# Patient Record
Sex: Male | Born: 1945 | Race: White | Hispanic: No | Marital: Married | Smoking: Former smoker
Health system: Southern US, Community
[De-identification: ages and names within clinical notes are randomized; demographics above are authoritative.]

## PROBLEM LIST (undated history)

## (undated) DIAGNOSIS — M5136 Other intervertebral disc degeneration, lumbar region: Secondary | ICD-10-CM

## (undated) DIAGNOSIS — I472 Ventricular tachycardia: Secondary | ICD-10-CM

## (undated) DIAGNOSIS — D696 Thrombocytopenia, unspecified: Secondary | ICD-10-CM

## (undated) DIAGNOSIS — G4730 Sleep apnea, unspecified: Secondary | ICD-10-CM

## (undated) DIAGNOSIS — E1122 Type 2 diabetes mellitus with diabetic chronic kidney disease: Secondary | ICD-10-CM

## (undated) DIAGNOSIS — I2510 Atherosclerotic heart disease of native coronary artery without angina pectoris: Secondary | ICD-10-CM

## (undated) DIAGNOSIS — I509 Heart failure, unspecified: Secondary | ICD-10-CM

## (undated) DIAGNOSIS — N186 End stage renal disease: Secondary | ICD-10-CM

## (undated) DIAGNOSIS — E119 Type 2 diabetes mellitus without complications: Secondary | ICD-10-CM

## (undated) DIAGNOSIS — I255 Ischemic cardiomyopathy: Secondary | ICD-10-CM

## (undated) DIAGNOSIS — I4892 Unspecified atrial flutter: Secondary | ICD-10-CM

## (undated) DIAGNOSIS — I729 Aneurysm of unspecified site: Secondary | ICD-10-CM

## (undated) DIAGNOSIS — Z85118 Personal history of other malignant neoplasm of bronchus and lung: Secondary | ICD-10-CM

## (undated) DIAGNOSIS — D638 Anemia in other chronic diseases classified elsewhere: Secondary | ICD-10-CM

## (undated) DIAGNOSIS — E782 Mixed hyperlipidemia: Secondary | ICD-10-CM

## (undated) DIAGNOSIS — E785 Hyperlipidemia, unspecified: Secondary | ICD-10-CM

## (undated) DIAGNOSIS — I482 Chronic atrial fibrillation, unspecified: Secondary | ICD-10-CM

## (undated) DIAGNOSIS — Z992 Dependence on renal dialysis: Secondary | ICD-10-CM

## (undated) DIAGNOSIS — R0609 Other forms of dyspnea: Secondary | ICD-10-CM

## (undated) DIAGNOSIS — K469 Unspecified abdominal hernia without obstruction or gangrene: Secondary | ICD-10-CM

## (undated) DIAGNOSIS — I714 Abdominal aortic aneurysm, without rupture: Secondary | ICD-10-CM

## (undated) HISTORY — DX: Thrombocytopenia, unspecified: D69.6

## (undated) HISTORY — DX: Other intervertebral disc degeneration, lumbar region: M51.36

## (undated) HISTORY — DX: Personal history of other malignant neoplasm of bronchus and lung: Z85.118

## (undated) HISTORY — PX: KNEE SURGERY: SHX244

## (undated) HISTORY — PX: PARTIAL COLECTOMY: SHX5273

## (undated) HISTORY — DX: Atherosclerotic heart disease of native coronary artery without angina pectoris: I25.10

## (undated) HISTORY — PX: REPLACEMENT TOTAL KNEE: SUR1224

## (undated) HISTORY — DX: Anemia in other chronic diseases classified elsewhere: D63.8

## (undated) HISTORY — PX: CARDIAC DEFIBRILLATOR PLACEMENT: SHX171

## (undated) HISTORY — DX: Chronic atrial fibrillation, unspecified: I48.20

## (undated) HISTORY — DX: Other forms of dyspnea: R06.09

## (undated) HISTORY — PX: CORONARY ARTERY BYPASS GRAFT: SHX141

## (undated) HISTORY — DX: Type 2 diabetes mellitus with diabetic chronic kidney disease: E11.22

## (undated) HISTORY — DX: Abdominal aortic aneurysm, without rupture: I71.4

## (undated) HISTORY — DX: Dependence on renal dialysis: Z99.2

## (undated) HISTORY — DX: Heart failure, unspecified: I50.9

## (undated) HISTORY — PX: CARDIAC SURGERY: SHX584

## (undated) HISTORY — DX: Unspecified atrial flutter: I48.92

## (undated) HISTORY — PX: COLON SURGERY: SHX602

## (undated) HISTORY — DX: Ischemic cardiomyopathy: I25.5

## (undated) HISTORY — PX: LUNG LOBECTOMY: SHX167

## (undated) HISTORY — DX: End stage renal disease: N18.6

## (undated) HISTORY — DX: Mixed hyperlipidemia: E78.2

## (undated) HISTORY — DX: Ventricular tachycardia: I47.2

---
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
## 2010-05-12 DIAGNOSIS — I472 Ventricular tachycardia, unspecified: Secondary | ICD-10-CM | POA: Insufficient documentation

## 2010-05-12 HISTORY — DX: Ventricular tachycardia, unspecified: I47.20

## 2010-05-12 HISTORY — DX: Ventricular tachycardia: I47.2

## 2014-01-12 ENCOUNTER — Emergency Department (INDEPENDENT_AMBULATORY_CARE_PROVIDER_SITE_OTHER)
Admission: EM | Admit: 2014-01-12 | Discharge: 2014-01-12 | Payer: PRIVATE HEALTH INSURANCE | Source: Home / Self Care | Attending: Emergency Medicine | Admitting: Emergency Medicine

## 2014-01-12 ENCOUNTER — Encounter: Payer: Self-pay | Admitting: *Deleted

## 2014-01-12 DIAGNOSIS — R0600 Dyspnea, unspecified: Secondary | ICD-10-CM

## 2014-01-12 HISTORY — DX: Aneurysm of unspecified site: I72.9

## 2014-01-12 HISTORY — DX: Type 2 diabetes mellitus without complications: E11.9

## 2014-01-12 HISTORY — DX: Hyperlipidemia, unspecified: E78.5

## 2014-01-12 HISTORY — DX: Unspecified abdominal hernia without obstruction or gangrene: K46.9

## 2014-01-12 HISTORY — DX: Sleep apnea, unspecified: G47.30

## 2014-01-12 NOTE — ED Provider Notes (Signed)
CSN: [id number]     Arrival date & time 01/12/14  1024 History   First MD Initiated Contact with Patient 01/12/14 1029     Chief Complaint  Patient presents with  . Shortness of Breath   (Consider location/radiation/quality/duration/timing/severity/associated sxs/prior Treatment) Patient is a 68 y.o. male presenting with shortness of breath. The history is provided by the patient.  Shortness of Breath Severity:  Moderate Onset quality:  Gradual Duration:  1 week Timing:  Constant Progression:  Worsening Chronicity:  New Context: activity   Relieved by:  Diuretics Ineffective treatments:  None tried Associated symptoms: no abdominal pain and no chest pain   Risk factors: no hx of PE/DVT and no prolonged immobilization    patient is visiting here from [place] he reports he has been short of breath for the past week. He is unsure if shortness of breath started after flying to [facility] before. Patient reports he is barely able to walk due to shortness of breath. She reports he does feel like he had some chills last night and he has had some nasal congestion he denies any chest pain patient denies any fevers. And abdominal hernia. Patient is a former smoker. She reports he is currently seeing a different doctors in his hometown in [place].  Past Medical History  Diagnosis Date  . Hernia of abdominal cavity   . Diabetes mellitus without complication   . Hyperlipidemia   . Sleep apnea   . Aneurysm     multiple    Past Surgical History  Procedure Laterality Date  . Colon surgery    . Knee surgery Bilateral   . Cardiac surgery      Quad Bypass  . Cardiac defibrillator placement     Family History  Problem Relation Age of Onset  . Dementia Mother   . Cancer Father     colon   History  Substance Use Topics  . Smoking status: Former Research scientist (life sciences)  . Smokeless tobacco: Not on file  . Alcohol Use: No    Review of Systems  Respiratory: Positive for shortness of breath.    Cardiovascular: Negative for chest pain.       Tachycardic  Gastrointestinal: Negative for abdominal pain.  All other systems reviewed and are negative.   Allergies  Review of patient's allergies indicates no known allergies.  Home Medications   Prior to Admission medications   Medication Sig Start Date End Date Taking? Authorizing Provider  aspirin 81 MG chewable tablet Chew by mouth daily.   Yes Historical Provider, MD  carvedilol (COREG) 6.25 MG tablet Take 6.25 mg by mouth 2 (two) times daily with a meal.   Yes Historical Provider, MD  Cholecalciferol (VITAMIN D-3 PO) Take by mouth.   Yes Historical Provider, MD  fenofibrate 160 MG tablet Take 160 mg by mouth daily.   Yes Historical Provider, MD  fesoterodine (TOVIAZ) 8 MG TB24 tablet Take 8 mg by mouth daily.   Yes Historical Provider, MD  Fish Oil-Cholecalciferol (FISH OIL + D3 PO) Take by mouth.   Yes Historical Provider, MD  glimepiride (AMARYL) 4 MG tablet Take 4 mg by mouth daily with breakfast.   Yes Historical Provider, MD  MULTIPLE VITAMIN PO Take by mouth.   Yes Historical Provider, MD  rosuvastatin (CRESTOR) 20 MG tablet Take 20 mg by mouth daily.   Yes Historical Provider, MD  sevelamer carbonate (RENVELA) 800 MG tablet Take 800 mg by mouth 3 (three) times daily with meals.  Yes Historical Provider, MD   BP 153/78 mmHg  Pulse 105  Temp(Src) 98 F (36.7 C) (Oral)  Resp 24  Ht 6\' 1"  (1.854 m)  Wt 297 lb (134.718 kg)  BMI 39.19 kg/m2  SpO2 91% Physical Exam  Constitutional: He appears well-developed and well-nourished.  HENT:  Head: Normocephalic.  Right Ear: External ear normal.  Left Ear: External ear normal.  Nose: Nose normal.  Mouth/Throat: Oropharynx is clear and moist.  Eyes: Pupils are equal, round, and reactive to light.  Neck: Normal range of motion. Neck supple.  Cardiovascular: Normal heart sounds.   Tachycardia  Pulmonary/Chest: Effort normal and breath sounds normal.  Abdominal: Soft. He  exhibits mass. There is tenderness.  Musculoskeletal: He exhibits edema.  Neurological: He is alert.  Skin: Skin is warm.  Psychiatric: He has a normal mood and affect.  Nursing note and vitals reviewed.   ED Course  Procedures (including critical care time) Labs Review Labs Reviewed - No data to display  Imaging Review No results found.   MDM patient recently had a dialysis graft placed in left arm due to progressing renal failure. He has edema in bilateral lower extremities and heart rate is elevated. I discussed patient's medical problems with him and his family I think he would be best served by being at an emergency department with the capability of checking his renal function cardiac markers and rule out pulmonary embolus. Patient understands and agrees. I discussed options with patient and son. Son would like patient to go to the [place], PA-C 01/12/14 1057

## 2014-01-12 NOTE — ED Notes (Signed)
Sent to the ED for further evaluation per Alyse Low, PA.

## 2014-01-12 NOTE — ED Notes (Signed)
Pt c/o SOB and chills x last night. Denies any recent URI or CP. He does c/o some nasal congestion for a while.

## 2014-01-13 DIAGNOSIS — N185 Chronic kidney disease, stage 5: Secondary | ICD-10-CM | POA: Insufficient documentation

## 2014-09-23 DIAGNOSIS — I4892 Unspecified atrial flutter: Secondary | ICD-10-CM

## 2014-09-23 HISTORY — DX: Unspecified atrial flutter: I48.92

## 2015-05-13 DIAGNOSIS — R0600 Dyspnea, unspecified: Secondary | ICD-10-CM

## 2015-05-13 DIAGNOSIS — R0609 Other forms of dyspnea: Secondary | ICD-10-CM

## 2015-05-13 DIAGNOSIS — Z7901 Long term (current) use of anticoagulants: Secondary | ICD-10-CM | POA: Insufficient documentation

## 2015-05-13 HISTORY — DX: Dyspnea, unspecified: R06.00

## 2015-05-13 HISTORY — DX: Other forms of dyspnea: R06.09

## 2016-02-28 LAB — PULMONARY FUNCTION TEST

## 2016-06-19 DIAGNOSIS — R079 Chest pain, unspecified: Secondary | ICD-10-CM | POA: Insufficient documentation

## 2016-10-04 ENCOUNTER — Ambulatory Visit: Payer: PRIVATE HEALTH INSURANCE | Admitting: Osteopathic Medicine

## 2016-10-09 DIAGNOSIS — I255 Ischemic cardiomyopathy: Secondary | ICD-10-CM

## 2016-10-09 HISTORY — DX: Ischemic cardiomyopathy: I25.5

## 2016-10-11 ENCOUNTER — Ambulatory Visit (INDEPENDENT_AMBULATORY_CARE_PROVIDER_SITE_OTHER): Payer: Medicare Other | Admitting: Physician Assistant

## 2016-10-11 ENCOUNTER — Encounter: Payer: Self-pay | Admitting: Physician Assistant

## 2016-10-11 ENCOUNTER — Ambulatory Visit: Payer: PRIVATE HEALTH INSURANCE

## 2016-10-11 ENCOUNTER — Encounter (INDEPENDENT_AMBULATORY_CARE_PROVIDER_SITE_OTHER): Payer: Self-pay

## 2016-10-11 VITALS — BP 89/56 | HR 81 | Wt 264.0 lb

## 2016-10-11 DIAGNOSIS — I209 Angina pectoris, unspecified: Secondary | ICD-10-CM | POA: Diagnosis not present

## 2016-10-11 DIAGNOSIS — I482 Chronic atrial fibrillation, unspecified: Secondary | ICD-10-CM

## 2016-10-11 DIAGNOSIS — M5136 Other intervertebral disc degeneration, lumbar region: Secondary | ICD-10-CM

## 2016-10-11 DIAGNOSIS — Z85118 Personal history of other malignant neoplasm of bronchus and lung: Secondary | ICD-10-CM | POA: Diagnosis not present

## 2016-10-11 DIAGNOSIS — Z992 Dependence on renal dialysis: Secondary | ICD-10-CM

## 2016-10-11 DIAGNOSIS — Z950 Presence of cardiac pacemaker: Secondary | ICD-10-CM | POA: Diagnosis not present

## 2016-10-11 DIAGNOSIS — D696 Thrombocytopenia, unspecified: Secondary | ICD-10-CM

## 2016-10-11 DIAGNOSIS — N400 Enlarged prostate without lower urinary tract symptoms: Secondary | ICD-10-CM | POA: Diagnosis not present

## 2016-10-11 DIAGNOSIS — N186 End stage renal disease: Secondary | ICD-10-CM

## 2016-10-11 DIAGNOSIS — D638 Anemia in other chronic diseases classified elsewhere: Secondary | ICD-10-CM | POA: Diagnosis not present

## 2016-10-11 DIAGNOSIS — Z23 Encounter for immunization: Secondary | ICD-10-CM

## 2016-10-11 DIAGNOSIS — Z8669 Personal history of other diseases of the nervous system and sense organs: Secondary | ICD-10-CM | POA: Diagnosis not present

## 2016-10-11 DIAGNOSIS — I2510 Atherosclerotic heart disease of native coronary artery without angina pectoris: Secondary | ICD-10-CM

## 2016-10-11 DIAGNOSIS — Z7689 Persons encountering health services in other specified circumstances: Secondary | ICD-10-CM

## 2016-10-11 DIAGNOSIS — I480 Paroxysmal atrial fibrillation: Secondary | ICD-10-CM | POA: Insufficient documentation

## 2016-10-11 DIAGNOSIS — I25119 Atherosclerotic heart disease of native coronary artery with unspecified angina pectoris: Secondary | ICD-10-CM

## 2016-10-11 DIAGNOSIS — M51369 Other intervertebral disc degeneration, lumbar region without mention of lumbar back pain or lower extremity pain: Secondary | ICD-10-CM | POA: Insufficient documentation

## 2016-10-11 DIAGNOSIS — E1122 Type 2 diabetes mellitus with diabetic chronic kidney disease: Secondary | ICD-10-CM

## 2016-10-11 DIAGNOSIS — R140 Abdominal distension (gaseous): Secondary | ICD-10-CM | POA: Insufficient documentation

## 2016-10-11 DIAGNOSIS — E782 Mixed hyperlipidemia: Secondary | ICD-10-CM

## 2016-10-11 DIAGNOSIS — Z95810 Presence of automatic (implantable) cardiac defibrillator: Secondary | ICD-10-CM | POA: Insufficient documentation

## 2016-10-11 DIAGNOSIS — I509 Heart failure, unspecified: Secondary | ICD-10-CM

## 2016-10-11 DIAGNOSIS — Z951 Presence of aortocoronary bypass graft: Secondary | ICD-10-CM | POA: Insufficient documentation

## 2016-10-11 HISTORY — DX: Thrombocytopenia, unspecified: D69.6

## 2016-10-11 HISTORY — DX: Atherosclerotic heart disease of native coronary artery without angina pectoris: I25.10

## 2016-10-11 HISTORY — DX: Other intervertebral disc degeneration, lumbar region without mention of lumbar back pain or lower extremity pain: M51.369

## 2016-10-11 HISTORY — DX: Chronic atrial fibrillation, unspecified: I48.20

## 2016-10-11 HISTORY — DX: Heart failure, unspecified: I50.9

## 2016-10-11 HISTORY — DX: End stage renal disease: N18.6

## 2016-10-11 HISTORY — DX: Personal history of other malignant neoplasm of bronchus and lung: Z85.118

## 2016-10-11 HISTORY — DX: Mixed hyperlipidemia: E78.2

## 2016-10-11 HISTORY — DX: Type 2 diabetes mellitus with diabetic chronic kidney disease: E11.22

## 2016-10-11 HISTORY — DX: Other intervertebral disc degeneration, lumbar region: M51.36

## 2016-10-11 HISTORY — DX: Anemia in other chronic diseases classified elsewhere: D63.8

## 2016-10-11 LAB — POCT INR: INR: 1.6

## 2016-10-11 NOTE — Patient Instructions (Addendum)
Warfarin -increase dose - 8 mg on Tuesday and Thursday - 7 mg on all other days - return in 1 week   Reduce your Losartan to 1/2 pill daily

## 2016-10-11 NOTE — Progress Notes (Signed)
HPI:                                                                Justin Browning is a 71 y.o. male who presents to [facility] today to establish care  Patient has a complex medical history  ESRD: followed by [facility]. Dialysis on M, W, F.   Afib/CHF/CAD: has an appointment with [facility] in [place]. He is on Warfarin 7mg  daily. Taking Carvedilol and Amiodarone without issues. He takes Torsedmide 50mg  on non-dialysis days. Compliant with medications.  Hx of adenocarcinoma of lung: s/p RLL lobectomy. He is on 2L of O2 at home. Requesting referral to pulmonology.   DMII: taking Tradjenta and Glimeperide daily. Not a candidate for Metformin due to renal disease. Compliant with medications. Last A1C unknown. Denies polyuria, vision change, and paresthesias. Denies hypoglycemic events. Denies ulcers/wounds on feet.  Depression/Anxiety: reports depressed mood due to chronic health issues, especially ESRD and dialysis. He is frustrated by his health and that he cannot go on vacation due to dialysis. He also has chronic back pain that depresses him. He endorses suicidal ideations, but states his grandchildren keep him going. Denies having a plan. Lives at home with his wife. No guns int he home. Denies symptoms of mania/hypomania. Denies auditory/visual hallucinations.  BPH: reports history of enlarged prostate and slightly elevated PSA. Requesting referral to Urology.   Past Medical History:  Diagnosis Date  . Anemia of chronic disease 10/11/2016  . Aneurysm ([place])    multiple   . Atrial flutter ([place]) 09/23/2014  . CAD (coronary artery disease) 10/11/2016   Overview:  Non STEMI 1/06 Cardiac Catheterization 1/06-Total occlusion of the LAD,OM1,LCX,RPDA CABG 02/15/04(LIMA-LAD,SVG-OM1,SVG-OM2-OM3) Postoperative course complicated by recurrent ventricular and atrial arrhythmias.Acute abdomen requiring exploratory laparotomy, partial  resection of the rectosigmoid colon, diverting colostomy s/p revision. AICD 2/06  . Chronic atrial fibrillation ([place]) 10/11/2016  . Chronic congestive heart failure ([place]) 10/11/2016  . Diabetes mellitus without complication ([place])   . Dyspnea on exertion 05/13/2015  . ESRD (end stage renal disease) on dialysis ([place])   . ESRD on dialysis ([place]) 10/11/2016  . Hernia of abdominal cavity   . History of adenocarcinoma of lung 10/11/2016   s/p partial lobectomy right lung  . Hyperlipidemia   . Ischemic cardiomyopathy 10/09/2016  . Mixed hyperlipidemia 10/11/2016  . Sleep apnea   . Thrombocytopenia ([place]) 10/11/2016  . Type 2 diabetes mellitus with chronic kidney disease on chronic dialysis, without long-term current use of insulin ([place]) 10/11/2016  . VT (ventricular tachycardia) ([place]) 05/12/2010   Overview:  AICD 03/09/04 [employer]   Past Surgical History:  Procedure Laterality Date  . CARDIAC DEFIBRILLATOR PLACEMENT    . CARDIAC SURGERY     Quad Bypass  . COLON SURGERY    . CORONARY ARTERY BYPASS GRAFT    . KNEE SURGERY Bilateral   . LUNG LOBECTOMY Right   . PARTIAL COLECTOMY     Social History  Substance Use Topics  . Smoking status: Former Smoker    Packs/day: 3.00    Years: 40.00    Types: Cigarettes  . Smokeless tobacco: Never Used  . Alcohol use No   family history includes Cancer in his father; Dementia in his mother.  ROS: Review of Systems  Constitutional: Negative.   HENT: Positive for tinnitus.   Eyes: Negative.   Respiratory: Positive for shortness of breath.   Cardiovascular: Positive for palpitations. Negative for chest pain.  Gastrointestinal: Negative.   Musculoskeletal: Positive for joint pain.  Neurological: Negative.   Endo/Heme/Allergies: Bruises/bleeds easily.       Anemia/thrombocytopenia  Psychiatric/Behavioral: The patient is nervous/anxious and has insomnia.      Medications: Current Outpatient Prescriptions  Medication Sig Dispense Refill  .  amiodarone (PACERONE) 200 MG tablet Take 200 mg by mouth daily.    [name] aspirin 81 MG chewable tablet Chew by mouth daily.    [name] atorvastatin (LIPITOR) 20 MG tablet Take 20 mg by mouth at bedtime.    . carvedilol (COREG) 6.25 MG tablet Take 6.25 mg by mouth 2 (two) times daily with a meal.    . Cholecalciferol (VITAMIN D-3 PO) Take by mouth.    . MULTIPLE VITAMIN PO Take by mouth.    . OXYGEN Inhale 2 L into the lungs as needed.    . sevelamer carbonate (RENVELA) 800 MG tablet Take 800 mg by mouth 3 (three) times daily with meals.    . warfarin (COUMADIN) 2 MG tablet Take 2 mg by mouth daily.    [name] warfarin (COUMADIN) 5 MG tablet Take 5 mg by mouth daily.    . Aspirin-Calcium Carbonate 81-777 MG TABS Take 1 tablet by mouth daily.    . Cholecalciferol (VITAMIN D3) 1000 units CAPS Vitamin D3 1,000 unit capsule  Take by oral route.    . fenofibrate 160 MG tablet Take 160 mg by mouth daily.    . fesoterodine (TOVIAZ) 8 MG TB24 tablet Take 8 mg by mouth daily.    . Fish Oil-Cholecalciferol (FISH OIL + D3 PO) Take by mouth.    . gabapentin (NEURONTIN) 300 MG capsule Take 300 mg by mouth daily.    [name] glimepiride (AMARYL) 4 MG tablet Take 4 mg by mouth daily with breakfast.    . linagliptin (TRADJENTA) 5 MG TABS tablet Take 5 mg by mouth daily.    . pantoprazole (PROTONIX) 20 MG tablet Take 40 mg by mouth daily.    [name] torsemide (DEMADEX) 100 MG tablet Take 50 mg by mouth. Take 1/2 tab PO on nondialysis days     No current facility-administered medications for this visit.    No Known Allergies     Objective:  BP (!) 89/56   Pulse 81   Wt 264 lb (119.7 kg)   BMI 34.83 kg/m  Gen:  alert, not ill-appearing, no distress, appropriate for age, obese male HEENT: head normocephalic without obvious abnormality, conjunctiva and cornea clear, trachea midline Pulm: Normal work of breathing, normal phonation, clear to auscultation bilaterally, no wheezes, rales or rhonchi CV: Normal rate, regular rhythm, s1  and s2 distinct Neuro: alert and oriented x 3, no tremor MSK: extremities atraumatic, fistula right arm, well-healed longitudinal surgical scars on b/l knees, ambulates with a cane, Skin: intact, no rashes on exposed skin, no jaundice, no cyanosis Psych: well-groomed, cooperative, good eye contact, depressed mood, affect mood-congruent, speech is articulate, and thought processes clear and goal-directed   Results for orders placed or performed in visit on 10/11/16 (from the past 72 hour(s))  POCT INR     Status: None   Collection Time: 10/11/16  9:51 AM  Result Value Ref Range   INR 1.6    No results found.   Assessment and Plan: 71 y.o. male  with   1. Encounter to establish care - reviewed PMH, PSH, PFH, medications and allergies  2. History of adenocarcinoma of lung - Ambulatory referral to Pulmonology  3. ESRD on dialysis ([place]) - cont dialysis, renal diet and follow-up with Nephrology  4. Chronic a-fib (HCC) - POCT INR subtherapeutic. Increasing dose to 8mg  on Tues, Thurs and 7mg  on all other days - follow-up in 1 week  5. History of sleep apnea - reports most recent sleep study was normal and no indication for CPAP  6. Type 2 diabetes mellitus with chronic kidney disease on chronic dialysis, without long-term current use of insulin (HCC) - cont current regimen - follow-up in 3 months  7. Mixed hyperlipidemia - cont statin  8. Artificial cardiac pacemaker - POCT INR  9. Coronary artery disease involving native heart with angina pectoris, unspecified vessel or lesion type, Chronic CHF - cont statin and baby asa.  - hold Losartan. BP is low - Follow-up with Cardiology  10. Anemia of chronic disease  11. Thrombocytopenia ([place])   12. Enlarged prostate - Ambulatory referral to Urology  13. Needs flu shot - Flu vaccine HIGH DOSE PF (Fluzone High dose)  14. Lumbar degenerative disc disease - cont Gabapentin daily - follow-up with Sports Medicine as  needed  Patient education and anticipatory guidance given Patient agrees with treatment plan Follow-up in 3 months or sooner as needed   Darlyne Russian PA-C

## 2016-10-15 ENCOUNTER — Encounter: Payer: Self-pay | Admitting: Physician Assistant

## 2016-10-18 ENCOUNTER — Telehealth: Payer: Self-pay | Admitting: *Deleted

## 2016-10-18 ENCOUNTER — Ambulatory Visit: Payer: PRIVATE HEALTH INSURANCE

## 2016-10-18 DIAGNOSIS — I482 Chronic atrial fibrillation, unspecified: Secondary | ICD-10-CM

## 2016-10-18 NOTE — Telephone Encounter (Signed)
Lab order sent for PT/INR

## 2016-10-19 ENCOUNTER — Telehealth: Payer: Self-pay

## 2016-10-19 LAB — PROTIME-INR
INR: 1.7 — AB
PROTHROMBIN TIME: 18.4 s — AB (ref 9.0–11.5)

## 2016-10-19 NOTE — Telephone Encounter (Signed)
Pt has 5 mg and 2 mg tabs.  Please disregard last note.  Wife was confused on doses he has.  He knows to take 8 mg on Sun, Tues, and Thurs, and 7 mg all other days.

## 2016-10-19 NOTE — Telephone Encounter (Signed)
Pt called regarding his INR and what medication he needs.  Please advise.

## 2016-10-19 NOTE — Telephone Encounter (Signed)
INR is subtherapeutic Increase dose 8 mg on Sunday, Tuesday, Thursday 7 mg on all other days Repeat INR in 10 days

## 2016-10-19 NOTE — Telephone Encounter (Signed)
Pt has only 5mg , and 2.5 mg tabs.  Can you order 1mg  or 2mg  tabs so they can do these doses?  Please advise.

## 2016-11-01 ENCOUNTER — Ambulatory Visit (INDEPENDENT_AMBULATORY_CARE_PROVIDER_SITE_OTHER): Payer: Medicare Other | Admitting: Physician Assistant

## 2016-11-01 DIAGNOSIS — I482 Chronic atrial fibrillation, unspecified: Secondary | ICD-10-CM

## 2016-11-01 LAB — POCT INR: INR: 2.8

## 2016-11-01 NOTE — Progress Notes (Signed)
Pt's wife notified of instructions and to return in 2 weeks to recheck INR.

## 2016-11-15 ENCOUNTER — Telehealth: Payer: Self-pay | Admitting: *Deleted

## 2016-11-15 ENCOUNTER — Ambulatory Visit (INDEPENDENT_AMBULATORY_CARE_PROVIDER_SITE_OTHER): Payer: Medicare Other | Admitting: Physician Assistant

## 2016-11-15 DIAGNOSIS — I482 Chronic atrial fibrillation, unspecified: Secondary | ICD-10-CM

## 2016-11-15 LAB — POCT INR: INR: 3.8

## 2016-11-15 NOTE — Telephone Encounter (Signed)
Called and notified spouse of recommendation. They will return to clinic in 10 days

## 2016-11-23 ENCOUNTER — Telehealth: Payer: Self-pay | Admitting: Physician Assistant

## 2016-11-23 NOTE — Telephone Encounter (Signed)
Spoke to [facility] They would like Crue to hold his Coumadin for 72 hours before his procedure Procedure is Tuesday, so he should have his last dose tonight. He does not need to come in for INR check with [name] next week. He should receive instructions from dialysis on when he can resume his Coumadin. Once he re-starts his Coumadin, recommend he come in for INR check in 1 week

## 2016-11-23 NOTE — Telephone Encounter (Signed)
Wife notified -EH/RMA  

## 2016-11-27 ENCOUNTER — Ambulatory Visit: Payer: Medicare Other

## 2016-11-27 ENCOUNTER — Ambulatory Visit: Payer: PRIVATE HEALTH INSURANCE

## 2016-11-27 ENCOUNTER — Other Ambulatory Visit: Payer: Self-pay

## 2016-11-27 DIAGNOSIS — I482 Chronic atrial fibrillation, unspecified: Secondary | ICD-10-CM

## 2016-11-27 LAB — PROTIME-INR
INR: 1.3 — ABNORMAL HIGH
Prothrombin Time: 13.5 s — ABNORMAL HIGH (ref 9.0–11.5)

## 2016-11-28 ENCOUNTER — Other Ambulatory Visit: Payer: Self-pay

## 2016-11-28 DIAGNOSIS — I482 Chronic atrial fibrillation, unspecified: Secondary | ICD-10-CM

## 2016-12-03 ENCOUNTER — Other Ambulatory Visit: Payer: Self-pay

## 2016-12-03 DIAGNOSIS — I482 Chronic atrial fibrillation, unspecified: Secondary | ICD-10-CM

## 2016-12-03 MED ORDER — WARFARIN SODIUM 2 MG PO TABS: 2.0000 mg | ORAL_TABLET | Freq: Every day | ORAL | 1 refills | Status: DC

## 2016-12-03 NOTE — Telephone Encounter (Signed)
Patient request refill for Warfarin 2 mg . #90 1 refill sent to [facility]. Rhonda Cunningham,CMA

## 2016-12-04 LAB — PROTIME-INR
INR: 1.5 — ABNORMAL HIGH
Prothrombin Time: 15.9 s — ABNORMAL HIGH (ref 9.0–11.5)

## 2016-12-04 NOTE — Progress Notes (Signed)
Increase Warfarin dose 8 mg on Tuesday & Thursday 7 mg all other days

## 2016-12-11 ENCOUNTER — Ambulatory Visit (INDEPENDENT_AMBULATORY_CARE_PROVIDER_SITE_OTHER): Payer: Medicare Other | Admitting: Family Medicine

## 2016-12-11 DIAGNOSIS — I482 Chronic atrial fibrillation, unspecified: Secondary | ICD-10-CM

## 2016-12-11 LAB — POCT INR: INR: 2.3

## 2016-12-11 NOTE — Progress Notes (Signed)
   Subjective:    Patient ID: Justin Browning, male    DOB: 12-Dec-1945, 71 y.o.   MRN: [id number]  HPI    Review of Systems     Objective:   Physical Exam        Assessment & Plan:  Atrial fibrillation-INR therapeutic at 2.3 today.  We will continue current regimen with 8 mg 2 days a week and 7 mg the other 5 days a week.  Recheck in 2 weeks.  Beatrice Lecher, MD

## 2016-12-18 ENCOUNTER — Ambulatory Visit: Payer: Medicare Other

## 2017-01-01 ENCOUNTER — Ambulatory Visit (INDEPENDENT_AMBULATORY_CARE_PROVIDER_SITE_OTHER): Payer: Medicare Other | Admitting: Sports Medicine

## 2017-01-01 DIAGNOSIS — I482 Chronic atrial fibrillation, unspecified: Secondary | ICD-10-CM

## 2017-01-01 LAB — POCT INR: INR: 2.2

## 2017-01-01 NOTE — Progress Notes (Signed)
Chronic Coumadin for atrial fibrillation, goal 2-3, currently 2.2, continue current dosing regimen, because he has been stable we will recheck INR in 1 month.

## 2017-01-01 NOTE — Progress Notes (Signed)
Patient's wife advised

## 2017-01-10 ENCOUNTER — Ambulatory Visit: Payer: Medicare Other | Admitting: Physician Assistant

## 2017-01-30 LAB — HM HEPATITIS C SCREENING LAB: HM Hepatitis Screen: NEGATIVE

## 2017-01-31 ENCOUNTER — Ambulatory Visit (INDEPENDENT_AMBULATORY_CARE_PROVIDER_SITE_OTHER): Payer: Medicare Other | Admitting: Physician Assistant

## 2017-01-31 ENCOUNTER — Encounter: Payer: Self-pay | Admitting: Physician Assistant

## 2017-01-31 ENCOUNTER — Ambulatory Visit: Payer: Medicare Other

## 2017-01-31 VITALS — BP 106/57 | HR 75 | Wt 264.0 lb

## 2017-01-31 DIAGNOSIS — N186 End stage renal disease: Secondary | ICD-10-CM | POA: Diagnosis not present

## 2017-01-31 DIAGNOSIS — E1122 Type 2 diabetes mellitus with diabetic chronic kidney disease: Secondary | ICD-10-CM

## 2017-01-31 DIAGNOSIS — I480 Paroxysmal atrial fibrillation: Secondary | ICD-10-CM | POA: Diagnosis not present

## 2017-01-31 DIAGNOSIS — Z7901 Long term (current) use of anticoagulants: Secondary | ICD-10-CM | POA: Diagnosis not present

## 2017-01-31 DIAGNOSIS — I714 Abdominal aortic aneurysm, without rupture, unspecified: Secondary | ICD-10-CM | POA: Insufficient documentation

## 2017-01-31 DIAGNOSIS — Z9981 Dependence on supplemental oxygen: Secondary | ICD-10-CM

## 2017-01-31 DIAGNOSIS — M159 Polyosteoarthritis, unspecified: Secondary | ICD-10-CM

## 2017-01-31 DIAGNOSIS — Z992 Dependence on renal dialysis: Secondary | ICD-10-CM | POA: Diagnosis not present

## 2017-01-31 DIAGNOSIS — Z79899 Other long term (current) drug therapy: Secondary | ICD-10-CM

## 2017-01-31 DIAGNOSIS — I5042 Chronic combined systolic (congestive) and diastolic (congestive) heart failure: Secondary | ICD-10-CM | POA: Diagnosis not present

## 2017-01-31 HISTORY — DX: Abdominal aortic aneurysm, without rupture: I71.4

## 2017-01-31 HISTORY — DX: Abdominal aortic aneurysm, without rupture, unspecified: I71.40

## 2017-01-31 LAB — POCT INR: INR: 1.9

## 2017-01-31 LAB — POCT GLYCOSYLATED HEMOGLOBIN (HGB A1C): Hemoglobin A1C: 5.7

## 2017-01-31 NOTE — Progress Notes (Signed)
HPI:                                                                Justin Browning is a 72 y.o. male who presents to [facility] today for medication management / INR check  Pleasant 72 yo M with complex PMH of heart failure reduced ejection fraction (EF 30%), CAD, ESRD on dialysis, type 2 DM presents today complaining of worsening dyspnea on exertion, which has made him more sedentary lately. States he wears 3L of oxygen at dialysis, but rarely wears O2 at home. Reports his SpO2 is 92% at rest. Denies fever, chills, cough or wheeze. Denies peripheral edema, weight gain, orthopnea. Denies chest pain or palpitations. Wife reports they have a pulmonology appointment at the end of January.  Afib: taking Coumadin 8 mg on Tues & Thurs and 7 mg all other days. Reports a couple of missed doses for a medical procedure at the end of December. Otherwise compliant with medications. Denies bleeding, melena, hematochezia.  AAA: wife reports history of abdominal aneurysms that she states were followed by a vein doctor in [place]. She states he is overdue for his surveillance ultrasounds.  DMII: taking Tradjenta 5mg  daily. Compliant with medications. Last A1C unknown. Does not check blood sugars at home. Denies hypoglycemic events. Denies ulcers/wounds on feet. Eye exam: due Foot exam: due Diet: fair Exercise: not currently, wants to join a gym and get back to water aerobics  No flowsheet data found.    Past Medical History:  Diagnosis Date  . Anemia of chronic disease 10/11/2016  . Aneurysm ([place])    multiple   . Atrial flutter ([place]) 09/23/2014  . CAD (coronary artery disease) 10/11/2016   Overview:  Non STEMI 1/06 Cardiac Catheterization 1/06-Total occlusion of the LAD,OM1,LCX,RPDA CABG 02/15/04(LIMA-LAD,SVG-OM1,SVG-OM2-OM3) Postoperative course complicated by recurrent ventricular and atrial arrhythmias.Acute abdomen requiring exploratory laparotomy,  partial resection of the rectosigmoid colon, diverting colostomy s/p revision. AICD 2/06  . Chronic atrial fibrillation ([place]) 10/11/2016  . Chronic congestive heart failure ([place]) 10/11/2016  . Diabetes mellitus without complication ([place])   . Dyspnea on exertion 05/13/2015  . ESRD (end stage renal disease) on dialysis ([place])   . ESRD on dialysis ([place]) 10/11/2016  . Hernia of abdominal cavity   . History of adenocarcinoma of lung 10/11/2016   s/p partial lobectomy right lung  . Hyperlipidemia   . Ischemic cardiomyopathy 10/09/2016  . Lumbar degenerative disc disease 10/11/2016  . Mixed hyperlipidemia 10/11/2016  . Sleep apnea   . Thrombocytopenia ([place]) 10/11/2016  . Type 2 diabetes mellitus with chronic kidney disease on chronic dialysis, without long-term current use of insulin ([place]) 10/11/2016  . VT (ventricular tachycardia) ([place]) 05/12/2010   Overview:  AICD 03/09/04 [employer]   Past Surgical History:  Procedure Laterality Date  . CARDIAC DEFIBRILLATOR PLACEMENT    . CARDIAC SURGERY     Quad Bypass  . COLON SURGERY    . CORONARY ARTERY BYPASS GRAFT    . CORONARY ARTERY BYPASS GRAFT    . [place]  . KNEE SURGERY Bilateral   . LUNG LOBECTOMY Right   . PARTIAL COLECTOMY    . REPLACEMENT TOTAL KNEE Left    Social History   Tobacco  Use  . Smoking status: Former Smoker    Packs/day: 3.00    Years: 40.00    Pack years: 120.00    Types: Cigarettes    Last attempt to quit: 08/20/2004    Years since quitting: 12.4  . Smokeless tobacco: Never Used  Substance Use Topics  . Alcohol use: No   family history includes Cancer in his father; Dementia in his mother; Diabetes in his mother; Stroke in his mother.    ROS: negative except as noted in the HPI  Medications: Current Outpatient Medications  Medication Sig Dispense Refill  . amiodarone (PACERONE) 200 MG tablet Take 200 mg by mouth daily.    [name] aspirin 81 MG chewable tablet Chew by mouth daily.    [name] atorvastatin  (LIPITOR) 20 MG tablet Take 20 mg by mouth at bedtime.    . carvedilol (COREG) 6.25 MG tablet Take 3.125 mg by mouth 2 (two) times daily with a meal.     . Cholecalciferol (VITAMIN D3) 1000 units CAPS Vitamin D3 1,000 unit capsule  Take by oral route.    . gabapentin (NEURONTIN) 300 MG capsule Take 300 mg by mouth daily.    [name] linagliptin (TRADJENTA) 5 MG TABS tablet Take 5 mg by mouth daily.    . MULTIPLE VITAMIN PO Take by mouth.    . OXYGEN Inhale 2 L into the lungs as needed.    . pantoprazole (PROTONIX) 20 MG tablet Take 40 mg by mouth daily.    . sevelamer carbonate (RENVELA) 800 MG tablet Take 800 mg by mouth 3 (three) times daily with meals.    . torsemide (DEMADEX) 100 MG tablet Take 50 mg by mouth. Take 1/2 tab PO on nondialysis days    . warfarin (COUMADIN) 2 MG tablet Take 2 mg by mouth daily. Take as directed    . warfarin (COUMADIN) 5 MG tablet Take 5 mg by mouth daily. Take as directed     No current facility-administered medications for this visit.    No Known Allergies     Objective:  BP (!) 106/57   Pulse 75   Wt 264 lb (119.7 kg)   SpO2 90%   BMI 34.83 kg/m  Gen:  alert, not ill-appearing, no distress, appropriate for age, obese male HEENT: head normocephalic without obvious abnormality, conjunctiva and cornea clear, trachea midline Pulm: Normal work of breathing, normal phonation, clear to auscultation bilaterally, no wheezes, rales or rhonchi CV: Normal rate, regular rhythm, s1 and s2 distinct, no murmurs, clicks or rubs  Neuro: alert and oriented x 3, no tremor MSK: extremities atraumatic, fistula right upper extremity, ambulating with a cane, antalgic gait Skin: intact, no rashes on exposed skin, no jaundice, no cyanosis    SIX MIN WALK 02/08/2017  Supplimental Oxygen during Test? (L/min) Yes  O2 Flow Rate 3  Type Continuous  Baseline SPO2 95  SPO2 90  Stopped or Paused before Six Minutes Yes  Other Symptoms at end of Exercise leg fatigue   Interpretation Leg pain     Results for orders placed or performed in visit on 01/31/17 (from the past 72 hour(s))  POCT HgB A1C     Status: None   Collection Time: 01/31/17 10:05 AM  Result Value Ref Range   Hemoglobin A1C 5.7   POCT INR     Status: None   Collection Time: 01/31/17 10:05 AM  Result Value Ref Range   INR 1.9    No results found.    Assessment and  Plan: 72 y.o. male with   1. Encounter for long-term (current) use of high-risk medication  2. Type 2 diabetes mellitus with chronic kidney disease on chronic dialysis, without long-term current use of insulin ([place]) Lab Results  Component Value Date   HGBA1C 5.7 01/31/2017  - well controlled - continue Tradjenta - no indication for ACE due to ESRD - continue Atorvastatin 20 mg - overdue for diabetic eye exam   4. Abdominal aortic aneurysm (AAA) without rupture ([place]) - I don't have any records of prior imaging - US Aorta; Future  5. Osteoarthritis of multiple joints, unspecified osteoarthritis type - follow-up with Sports Medicine - Ambulatory referral to Physical Therapy for aquatic PT  6. Paroxysmal A-fib [facility]) Lab Results  Component Value Date   INR 1.9 01/31/2017   INR 2.2 01/01/2017   INR 2.3 12/11/2016  - keep dose the same, recheck in 10 days   7. Long term current use of anticoagulants with INR goal of 2.0-3.0 - POCT INR  8. Chronic combined systolic and diastolic congestive heart failure (HCC) Wt Readings from Last 3 Encounters:  01/31/17 264 lb (119.7 kg)  01/01/17 263 lb (119.3 kg)  11/15/16 264 lb (119.7 kg)  - 6 minute walk test failed after 2 minutes - encouraged to wear home oxygen 3L and continue to monitor SpO2 to a goal of >92%. He has some supplies leftover from [place]. Referring to [employer] for supplies - based on EF alone, patient is a candidate for [employer]. He would need renal dose of Sacubitril 24 mg/valsartan 26 mg twice daily He is currently on Carvedilol and  Amiodarone - will contact his cardiologist to see if he recommends Entresto   Patient education and anticipatory guidance given Patient agrees with treatment plan Follow-up every 3 months for medication management or sooner as needed if symptoms worsen or fail to improve  Darlyne Russian PA-C

## 2017-02-01 ENCOUNTER — Encounter: Payer: Self-pay | Admitting: Physician Assistant

## 2017-02-08 ENCOUNTER — Encounter: Payer: Self-pay | Admitting: Physician Assistant

## 2017-02-08 DIAGNOSIS — M159 Polyosteoarthritis, unspecified: Secondary | ICD-10-CM | POA: Insufficient documentation

## 2017-02-08 DIAGNOSIS — Z9981 Dependence on supplemental oxygen: Secondary | ICD-10-CM | POA: Insufficient documentation

## 2017-02-08 MED ORDER — NONFORMULARY OR COMPOUNDED ITEM: 0 refills | Status: AC

## 2017-02-12 ENCOUNTER — Ambulatory Visit (INDEPENDENT_AMBULATORY_CARE_PROVIDER_SITE_OTHER): Payer: Medicare Other | Admitting: Sports Medicine

## 2017-02-12 DIAGNOSIS — I480 Paroxysmal atrial fibrillation: Secondary | ICD-10-CM

## 2017-02-12 LAB — POCT INR: INR: 2.3

## 2017-02-12 NOTE — Progress Notes (Signed)
Same dose. 8 mg on Tue and Thur. 7mg  all other days. Recheck in 4 weeks.

## 2017-02-12 NOTE — Progress Notes (Signed)
Pt's wife advised.

## 2017-02-19 ENCOUNTER — Encounter: Payer: Self-pay | Admitting: Sports Medicine

## 2017-02-19 ENCOUNTER — Ambulatory Visit (INDEPENDENT_AMBULATORY_CARE_PROVIDER_SITE_OTHER): Payer: Medicare Other | Admitting: Sports Medicine

## 2017-02-19 DIAGNOSIS — Z992 Dependence on renal dialysis: Secondary | ICD-10-CM | POA: Diagnosis not present

## 2017-02-19 DIAGNOSIS — N186 End stage renal disease: Secondary | ICD-10-CM | POA: Diagnosis not present

## 2017-02-19 DIAGNOSIS — M5136 Other intervertebral disc degeneration, lumbar region: Secondary | ICD-10-CM

## 2017-02-19 DIAGNOSIS — M51369 Other intervertebral disc degeneration, lumbar region without mention of lumbar back pain or lower extremity pain: Secondary | ICD-10-CM

## 2017-02-19 NOTE — Progress Notes (Signed)
Subjective:    I'm seeing this patient as a consultation for: Nelson Chimes, PA-C  CC: Leg weakness  HPI: This is a pleasant 72 year old male with a history of diabetes as well as on dialysis, end-stage renal disease secondary to diabetes, diabetes is currently well controlled.  He has had bilateral knee arthroplasties.  He tells me that he has episodes of difficulty rising from a seated position, not due to pain in his hips, knees, but more weakness.  He does have known significant lumbar degenerative disc disease, I do not have any advanced imaging to review but he has noted progressive weakness with numbness going into both legs.  No bowel or bladder dysfunction, no saddle numbness constitutional symptoms.  He is fairly risk averse and tells me he really does not want to consider lumbar interventions.  He is currently going to be starting aquatic physical therapy.  I reviewed the past medical history, family history, social history, surgical history, and allergies today and no changes were needed.  Please see the problem list section below in epic for further details.  Past Medical History: Past Medical History:  Diagnosis Date  . Anemia of chronic disease 10/11/2016  . Aneurysm ([place])    multiple   . Atrial flutter ([place]) 09/23/2014  . CAD (coronary artery disease) 10/11/2016   Overview:  Non STEMI 1/06 Cardiac Catheterization 1/06-Total occlusion of the LAD,OM1,LCX,RPDA CABG 02/15/04(LIMA-LAD,SVG-OM1,SVG-OM2-OM3) Postoperative course complicated by recurrent ventricular and atrial arrhythmias.Acute abdomen requiring exploratory laparotomy, partial resection of the rectosigmoid colon, diverting colostomy s/p revision. AICD 2/06  . Chronic atrial fibrillation ([place]) 10/11/2016  . Chronic congestive heart failure ([place]) 10/11/2016  . Diabetes mellitus without complication ([place])   . Dyspnea on exertion 05/13/2015  . ESRD (end stage renal disease) on dialysis ([place])   . ESRD on dialysis ([place])  10/11/2016  . Hernia of abdominal cavity   . History of adenocarcinoma of lung 10/11/2016   s/p partial lobectomy right lung  . Hyperlipidemia   . Ischemic cardiomyopathy 10/09/2016  . Lumbar degenerative disc disease 10/11/2016  . Mixed hyperlipidemia 10/11/2016  . Sleep apnea   . Thrombocytopenia ([place]) 10/11/2016  . Type 2 diabetes mellitus with chronic kidney disease on chronic dialysis, without long-term current use of insulin ([place]) 10/11/2016  . VT (ventricular tachycardia) ([place]) 05/12/2010   Overview:  AICD 03/09/04 [employer]   Past Surgical History: Past Surgical History:  Procedure Laterality Date  . CARDIAC DEFIBRILLATOR PLACEMENT    . CARDIAC SURGERY     Quad Bypass  . COLON SURGERY    . CORONARY ARTERY BYPASS GRAFT    . CORONARY ARTERY BYPASS GRAFT    . [place]  . KNEE SURGERY Bilateral   . LUNG LOBECTOMY Right   . PARTIAL COLECTOMY    . REPLACEMENT TOTAL KNEE Left    Social History: Social History   Socioeconomic History  . Marital status: Married    Spouse name: None  . Number of children: None  . Years of education: None  . Highest education level: None  Social Needs  . Financial resource strain: None  . Food insecurity - worry: None  . Food insecurity - inability: None  . Transportation needs - medical: None  . Transportation needs - non-medical: None  Occupational History  . None  Tobacco Use  . Smoking status: Former Smoker    Packs/day: 3.00    Years: 40.00    Pack years: 120.00    Types: Cigarettes  Last attempt to quit: 08/20/2004    Years since quitting: 12.5  . Smokeless tobacco: Never Used  Substance and Sexual Activity  . Alcohol use: No  . Drug use: No  . Sexual activity: Not Currently  Other Topics Concern  . None  Social History Narrative  . None   Family History: Family History  Problem Relation Age of Onset  . Dementia Mother   . Diabetes Mother   . Stroke Mother   . Cancer Father        colon    Allergies: No Known Allergies Medications: See med rec.  Review of Systems: No headache, visual changes, nausea, vomiting, diarrhea, constipation, dizziness, abdominal pain, skin rash, fevers, chills, night sweats, weight loss, swollen lymph nodes, body aches, joint swelling, muscle aches, chest pain, shortness of breath, mood changes, visual or auditory hallucinations.   Objective:   General: Well Developed, well nourished, and in no acute distress.  Neuro:  Extra-ocular muscles intact, able to move all 4 extremities, sensation grossly intact.  Deep tendon reflexes tested were normal. Psych: Alert and oriented, mood congruent with affect. ENT:  Ears and nose appear unremarkable.  Hearing grossly normal. Neck: Unremarkable overall appearance, trachea midline.  No visible thyroid enlargement. Eyes: Conjunctivae and lids appear unremarkable.  Pupils equal and round. Skin: Warm and dry, no rashes noted.  Cardiovascular: Pulses palpable, no extremity edema. Back Exam:  Inspection: Unremarkable  Motion: Flexion 45 deg, Extension 45 deg, Side Bending to 45 deg bilaterally,  Rotation to 45 deg bilaterally  SLR laying: Negative  XSLR laying: Negative  Palpable tenderness: None. FABER: negative. Sensory change: Gross sensation intact to all lumbar and sacral dermatomes.  Reflexes: 2+ at both patellar tendons, 2+ at achilles tendons, Babinski's downgoing.  Strength at foot  Plantar-flexion: 5/5 Dorsi-flexion: 4-/5 Eversion: 5/5 Inversion: 5/5  Leg strength  Quad: 5/5 Hamstring: 5/5 Hip flexor: 5/5 Hip abductors: 5/5  Gait unremarkable.  Impression and Recommendations:   This case required medical decision making of moderate complexity.  Lumbar degenerative disc disease Progressive lower extremity weakness. I do not think this is arthritic, he is post bilateral knee arthroplasty, hip pain is not an issue. Starting aquatic physical therapy, this is a great idea, because of the  progressive weakness I would also like him to proceed with MRI. He does have a defibrillator, they are going to call the cardiologist and see if this is MRI safe defibrillator, if not we will have to do a simple CT lumbar spine. Checking some electrolytes as well, currently on dialysis. He is somewhat risk averse with regards to spinal injections.  ___________________________________________ Gwen Her. Dianah Field, M.D., ABFM., CAQSM. Primary Care and [place] Instructor of [employer] [facility] [employer]

## 2017-02-19 NOTE — Assessment & Plan Note (Signed)
Progressive lower extremity weakness. I do not think this is arthritic, he is post bilateral knee arthroplasty, hip pain is not an issue. Starting aquatic physical therapy, this is a great idea, because of the progressive weakness I would also like him to proceed with MRI. He does have a defibrillator, they are going to call the cardiologist and see if this is MRI safe defibrillator, if not we will have to do a simple CT lumbar spine. Checking some electrolytes as well, currently on dialysis. He is somewhat risk averse with regards to spinal injections.

## 2017-02-20 ENCOUNTER — Telehealth: Payer: Self-pay

## 2017-02-20 DIAGNOSIS — M5136 Other intervertebral disc degeneration, lumbar region: Secondary | ICD-10-CM

## 2017-02-20 DIAGNOSIS — M51369 Other intervertebral disc degeneration, lumbar region without mention of lumbar back pain or lower extremity pain: Secondary | ICD-10-CM

## 2017-02-20 LAB — COMPREHENSIVE METABOLIC PANEL WITH GFR
AG Ratio: 1.5 (calc) (ref 1.0–2.5)
Albumin: 4.1 g/dL (ref 3.6–5.1)
BUN/Creatinine Ratio: 7 (calc) (ref 6–22)
BUN: 48 mg/dL — ABNORMAL HIGH (ref 7–25)
CO2: 25 mmol/L (ref 20–32)
Glucose, Bld: 196 mg/dL — ABNORMAL HIGH (ref 65–99)
Potassium: 4.3 mmol/L (ref 3.5–5.3)
Sodium: 139 mmol/L (ref 135–146)
Total Protein: 6.9 g/dL (ref 6.1–8.1)

## 2017-02-20 LAB — ANEMIA PROFILE B
%SAT: 28 % (calc) (ref 15–60)
ABS Retic: 87400 cells/uL (ref 25000–9000)
Basophils Absolute: 42 {cells}/uL (ref 0–200)
Basophils Relative: 0.8 %
Eosinophils Absolute: 172 cells/uL (ref 15–500)
Eosinophils Relative: 3.3 %
Ferritin: 625 ng/mL — ABNORMAL HIGH (ref 20–380)
Folate: 17 ng/mL
HCT: 33 % — ABNORMAL LOW (ref 38.5–50.0)
Hemoglobin: 11.2 g/dL — ABNORMAL LOW (ref 13.2–17.1)
Iron: 64 ug/dL (ref 50–180)
Lymphs Abs: 562 cells/uL — ABNORMAL LOW (ref 850–3900)
MCH: 29.6 pg (ref 27.0–33.0)
MCHC: 33.9 g/dL (ref 32.0–36.0)
MCV: 87.3 fL (ref 80.0–100.0)
MPV: 10.7 fL (ref 7.5–12.5)
Monocytes Relative: 10.6 %
Neutro Abs: 3874 cells/uL (ref 1500–7800)
Neutrophils Relative %: 74.5 %
Platelets: 107 Thousand/uL — ABNORMAL LOW (ref 140–400)
RBC: 3.78 10*6/uL — ABNORMAL LOW (ref 4.20–5.80)
RDW: 15.4 % — ABNORMAL HIGH (ref 11.0–15.0)
Retic Ct Pct: 2.3 %
TIBC: 227 mcg/dL (calc) — ABNORMAL LOW (ref 250–425)
Total Lymphocyte: 10.8 %
Vitamin B-12: 764 pg/mL (ref 200–1100)
WBC mixed population: 551 cells/uL (ref 200–950)
WBC: 5.2 10*3/uL (ref 3.8–10.8)

## 2017-02-20 LAB — MAGNESIUM: Magnesium: 2 mg/dL (ref 1.5–2.5)

## 2017-02-20 LAB — COMPREHENSIVE METABOLIC PANEL
ALT: 32 U/L (ref 9–46)
AST: 17 U/L (ref 10–35)
Alkaline phosphatase (APISO): 148 U/L — ABNORMAL HIGH (ref 40–115)
Calcium: 9 mg/dL (ref 8.6–10.3)
Chloride: 105 mmol/L (ref 98–110)
Creat: 6.77 mg/dL — ABNORMAL HIGH (ref 0.70–1.25)
Globulin: 2.8 g/dL (calc) (ref 1.9–3.7)
Total Bilirubin: 0.5 mg/dL (ref 0.2–1.2)

## 2017-02-20 LAB — CK: Total CK: 74 U/L (ref 44–196)

## 2017-02-20 NOTE — Telephone Encounter (Signed)
Switching to CT lumbar spine.

## 2017-02-20 NOTE — Telephone Encounter (Signed)
Wife of pt called to inform us pt is not allowed to get an MRI with his defibrillator. Please assist with order change.

## 2017-02-21 ENCOUNTER — Ambulatory Visit (HOSPITAL_BASED_OUTPATIENT_CLINIC_OR_DEPARTMENT_OTHER)
Admission: RE | Admit: 2017-02-21 | Discharge: 2017-02-21 | Disposition: A | Discharge status: Home / Self Care | Payer: Medicare Other | Source: Ambulatory Visit | Attending: Physician Assistant | Admitting: Physician Assistant

## 2017-02-21 DIAGNOSIS — I714 Abdominal aortic aneurysm, without rupture, unspecified: Secondary | ICD-10-CM

## 2017-02-21 DIAGNOSIS — I723 Aneurysm of iliac artery: Secondary | ICD-10-CM | POA: Insufficient documentation

## 2017-02-24 ENCOUNTER — Encounter: Payer: Self-pay | Admitting: Physician Assistant

## 2017-02-24 DIAGNOSIS — I723 Aneurysm of iliac artery: Secondary | ICD-10-CM | POA: Insufficient documentation

## 2017-02-24 NOTE — Progress Notes (Signed)
The abdominal aorta aneurysm (triple A) is stable and less than 4 cm. 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 These are less than 3 cm, which also requires observation Follow-up sooner if there is calf pain, pelvic pain or abdominal pain

## 2017-02-25 ENCOUNTER — Other Ambulatory Visit: Payer: Self-pay | Admitting: Physician Assistant

## 2017-02-25 DIAGNOSIS — I714 Abdominal aortic aneurysm, without rupture, unspecified: Secondary | ICD-10-CM

## 2017-02-25 DIAGNOSIS — I723 Aneurysm of iliac artery: Secondary | ICD-10-CM

## 2017-02-26 ENCOUNTER — Other Ambulatory Visit: Payer: Self-pay

## 2017-02-26 ENCOUNTER — Ambulatory Visit (INDEPENDENT_AMBULATORY_CARE_PROVIDER_SITE_OTHER): Payer: Medicare Other

## 2017-02-26 DIAGNOSIS — I700 Atherosclerosis of aorta: Secondary | ICD-10-CM

## 2017-02-26 DIAGNOSIS — I714 Abdominal aortic aneurysm, without rupture, unspecified: Secondary | ICD-10-CM

## 2017-02-26 DIAGNOSIS — M5126 Other intervertebral disc displacement, lumbar region: Secondary | ICD-10-CM

## 2017-02-26 DIAGNOSIS — M47898 Other spondylosis, sacral and sacrococcygeal region: Secondary | ICD-10-CM | POA: Diagnosis not present

## 2017-02-26 DIAGNOSIS — M5136 Other intervertebral disc degeneration, lumbar region: Secondary | ICD-10-CM

## 2017-02-26 DIAGNOSIS — M51369 Other intervertebral disc degeneration, lumbar region without mention of lumbar back pain or lower extremity pain: Secondary | ICD-10-CM

## 2017-02-26 DIAGNOSIS — I719 Aortic aneurysm of unspecified site, without rupture: Secondary | ICD-10-CM

## 2017-02-26 MED ORDER — WARFARIN SODIUM 5 MG PO TABS: 5.0000 mg | ORAL_TABLET | ORAL | 0 refills | Status: DC

## 2017-02-26 NOTE — Assessment & Plan Note (Signed)
I would like this followed by vascular surgery.

## 2017-03-04 ENCOUNTER — Other Ambulatory Visit: Payer: Self-pay

## 2017-03-04 NOTE — Progress Notes (Signed)
error 

## 2017-03-14 ENCOUNTER — Ambulatory Visit (INDEPENDENT_AMBULATORY_CARE_PROVIDER_SITE_OTHER): Payer: Medicare Other | Admitting: Physician Assistant

## 2017-03-14 DIAGNOSIS — I480 Paroxysmal atrial fibrillation: Secondary | ICD-10-CM | POA: Diagnosis not present

## 2017-03-14 LAB — POCT INR: INR: 2.5

## 2017-03-14 NOTE — Progress Notes (Signed)
Wife notified -EH/RMA  

## 2017-03-14 NOTE — Progress Notes (Signed)
Pt's wife advised.

## 2017-03-14 NOTE — Progress Notes (Signed)
  Patient does NOT need to hold his Coumadin for minor dental procedures, including cleaning.  As far as antibiotic prophylaxis, this is a little complicated. He does not need antibiotics for a routine cleaning.  If he is going to be having a procedure or deep cleaning/scaling, he could do antibiotics. However, I would defer to his nephrologist on which medication to use and the dose. Typically we have patient take a pill 1 hour before their appointment, but this gets much trickier when dialysis is involved.  So he should  1. Contact his dentist to find out exactly what they plan to do and then, 2. Contact his nephrologist about antibiotic prophylaxis

## 2017-03-19 ENCOUNTER — Encounter: Payer: Self-pay | Admitting: Sports Medicine

## 2017-03-19 ENCOUNTER — Ambulatory Visit (INDEPENDENT_AMBULATORY_CARE_PROVIDER_SITE_OTHER): Payer: Medicare Other | Admitting: Sports Medicine

## 2017-03-19 DIAGNOSIS — M5136 Other intervertebral disc degeneration, lumbar region: Secondary | ICD-10-CM

## 2017-03-19 DIAGNOSIS — M51369 Other intervertebral disc degeneration, lumbar region without mention of lumbar back pain or lower extremity pain: Secondary | ICD-10-CM

## 2017-03-19 NOTE — Assessment & Plan Note (Addendum)
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 Has declined any forms of procedures. He will continue with aquatic therapy which is helping significantly, I think he is probably safe to swim around in the pool in a more shallow area outside of his physical therapy. He has done well in the past with various types of manipulation, referral to Dr. Emeterio Reeve for this. He can come see me as needed.

## 2017-03-19 NOTE — Progress Notes (Signed)
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  We had him do aquatic physical therapy, and he is noting small improvements in his strength.  Continues to have axial back pain with bilateral radicular pain.  I reviewed the past medical history, family history, social history, surgical history, and allergies today and no changes were needed.  Please see the problem list section below in epic for further details.  Past Medical History: Past Medical History:  Diagnosis Date  . Abdominal aortic aneurysm (AAA) without rupture ([place]) 01/31/2017  . Anemia of chronic disease 10/11/2016  . Aneurysm ([place])    multiple   . Atrial flutter ([place]) 09/23/2014  . CAD (coronary artery disease) 10/11/2016   Overview:  Non STEMI 1/06 Cardiac Catheterization 1/06-Total occlusion of the LAD,OM1,LCX,RPDA CABG 02/15/04(LIMA-LAD,SVG-OM1,SVG-OM2-OM3) Postoperative course complicated by recurrent ventricular and atrial arrhythmias.Acute abdomen requiring exploratory laparotomy, partial resection of the rectosigmoid colon, diverting colostomy s/p revision. AICD 2/06  . Chronic atrial fibrillation ([place]) 10/11/2016  . Chronic congestive heart failure ([place]) 10/11/2016  . Diabetes mellitus without complication ([place])   . Dyspnea on exertion 05/13/2015  . ESRD (end stage renal disease) on dialysis ([place])   . ESRD on dialysis ([place]) 10/11/2016  . Hernia of abdominal cavity   . History of adenocarcinoma of lung 10/11/2016   s/p partial lobectomy right lung  . Hyperlipidemia   . Ischemic cardiomyopathy 10/09/2016  . Lumbar degenerative disc disease 10/11/2016  . Mixed hyperlipidemia 10/11/2016  . Sleep apnea   . Thrombocytopenia ([place]) 10/11/2016  . Type 2 diabetes mellitus with chronic kidney disease on chronic dialysis, without long-term current  use of insulin ([place]) 10/11/2016  . VT (ventricular tachycardia) ([place]) 05/12/2010   Overview:  AICD 03/09/04 [employer]   Past Surgical History: Past Surgical History:  Procedure Laterality Date  . CARDIAC DEFIBRILLATOR PLACEMENT    . CARDIAC SURGERY     Quad Bypass  . COLON SURGERY    . CORONARY ARTERY BYPASS GRAFT    . CORONARY ARTERY BYPASS GRAFT    . [place]  . KNEE SURGERY Bilateral   . LUNG LOBECTOMY Right   . PARTIAL COLECTOMY    . REPLACEMENT TOTAL KNEE Left    Social History: Social History   Socioeconomic History  . Marital status: Married    Spouse name: None  . Number of children: None  . Years of education: None  . Highest education level: None  Social Needs  . Financial resource strain: None  . Food insecurity - worry: None  . Food insecurity - inability: None  . Transportation needs - medical: None  . Transportation needs - non-medical: None  Occupational History  . None  Tobacco Use  . Smoking status: Former Smoker    Packs/day: 3.00    Years: 40.00    Pack years: 120.00    Types: Cigarettes    Last attempt to quit: 08/20/2004    Years since quitting: 12.5  . Smokeless tobacco: Never Used  Substance and Sexual Activity  . Alcohol use: No  . Drug use: No  . Sexual activity: Not Currently  Other Topics Concern  . None  Social History Narrative  . None   Family History: Family History  Problem Relation Age of Onset  . Dementia Mother   . Diabetes  Mother   . Stroke Mother   . Cancer Father        colon   Allergies: No Known Allergies Medications: See med rec.  Review of Systems: No fevers, chills, night sweats, weight loss, chest pain, or shortness of breath.   Objective:    General: Well Developed, well nourished, and in no acute distress.  Neuro: Alert and oriented x3, extra-ocular muscles intact, sensation grossly intact.  HEENT: Normocephalic, atraumatic, pupils equal round reactive to light, neck supple, no masses,  no lymphadenopathy, thyroid nonpalpable.  Skin: Warm and dry, no rashes. Cardiac: Regular rate and rhythm, no murmurs rubs or gallops, no lower extremity edema.  Respiratory: Clear to auscultation bilaterally. Not using accessory muscles, speaking in full sentences.  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 Has declined any forms of procedures. He will continue with aquatic therapy which is helping significantly, I think he is probably safe to swim around in the pool in a more shallow area outside of his physical therapy. He has done well in the past with various types of manipulation, referral to Dr. Emeterio Reeve for this. He can come see me as needed.  I spent 25 minutes with this patient, greater than 50% was face-to-face time counseling regarding the above diagnoses ___________________________________________ Gwen Her. Dianah Field, M.D., ABFM., CAQSM. Primary Care and [place] Instructor of [place] [employer]

## 2017-03-21 ENCOUNTER — Encounter: Payer: Self-pay | Admitting: Osteopathic Medicine

## 2017-03-21 ENCOUNTER — Ambulatory Visit (INDEPENDENT_AMBULATORY_CARE_PROVIDER_SITE_OTHER): Payer: Medicare Other | Admitting: Osteopathic Medicine

## 2017-03-21 VITALS — BP 118/62 | HR 79 | Temp 98.1°F | Wt 261.1 lb

## 2017-03-21 DIAGNOSIS — M9903 Segmental and somatic dysfunction of lumbar region: Secondary | ICD-10-CM | POA: Diagnosis not present

## 2017-03-21 DIAGNOSIS — M5136 Other intervertebral disc degeneration, lumbar region: Secondary | ICD-10-CM | POA: Diagnosis not present

## 2017-03-21 NOTE — Progress Notes (Signed)
HPI: Justin Browning is a 72 y.o. male who  has a past medical history of Abdominal aortic aneurysm (AAA) without rupture ([place]) (01/31/2017), Anemia of chronic disease (10/11/2016), Aneurysm ([place]), Atrial flutter ([place]) (09/23/2014), CAD (coronary artery disease) (10/11/2016), Chronic atrial fibrillation ([place]) (10/11/2016), Chronic congestive heart failure ([place]) (10/11/2016), Diabetes mellitus without complication ([place]), Dyspnea on exertion (05/13/2015), ESRD (end stage renal disease) on dialysis [facility]), ESRD on dialysis ([place]) (10/11/2016), Hernia of abdominal cavity, History of adenocarcinoma of lung (10/11/2016), Hyperlipidemia, Ischemic cardiomyopathy (10/09/2016), Lumbar degenerative disc disease (10/11/2016), Mixed hyperlipidemia (10/11/2016), Sleep apnea, Thrombocytopenia ([place]) (10/11/2016), Type 2 diabetes mellitus with chronic kidney disease on chronic dialysis, without long-term current use of insulin ([place]) (10/11/2016), and VT (ventricular tachycardia) ([place]) (05/12/2010).  [facility]21[facility],  for chief complaint of: OMT for back pain   Hx central canal stenosis Lumbar spine, opted for no invasive treatment, aquatic PT. Continues to have axial back pain with bilateral radiculopathy. Has done well with manipulations in the past when he was living in [place] so sports med referred him to me for osteopathic evaluation.   Manipulation treatments he has had before doesn't sound like any kind of formal osteopathic physician her chiropractor, but states that he knew a guy who would stretch the muscles in the back and he'll resolve relief after the sections.  Of note, positive pH Q9 certainly was a concern. I addressed this briefly with the patient. He states that he always feels depressed when she is not able to around very well, particularly concerning was the thoughts of better off dead, he endorses passive death wish but no thoughts of suicide    Past  medical, surgical, social and family history reviewed:  Patient Active Problem List   Diagnosis Date Noted  . Iliac artery aneurysm, bilateral ([place]) 02/24/2017  . Osteoarthritis of multiple joints 02/08/2017  . Requires continuous at home supplemental oxygen 02/08/2017  . Encounter for long-term (current) use of high-risk medication 01/31/2017  . Abdominal aortic aneurysm (AAA) without rupture ([place]) 01/31/2017  . History of adenocarcinoma of lung 10/11/2016  . ESRD on dialysis ([place]) 10/11/2016  . Paroxysmal A-fib ([place]) 10/11/2016  . Type 2 diabetes mellitus with chronic kidney disease on chronic dialysis, without long-term current use of insulin ([place]) 10/11/2016  . Mixed hyperlipidemia 10/11/2016  . Artificial cardiac pacemaker 10/11/2016  . Coronary artery disease involving native coronary artery of native heart without angina pectoris 10/11/2016  . Anemia of chronic disease 10/11/2016  . Thrombocytopenia ([place]) 10/11/2016  . AICD (automatic cardioverter/defibrillator) present 10/11/2016  . Chronic congestive heart failure ([place]) 10/11/2016  . Hx of CABG 10/11/2016  . Non-gaseous abdominal distention 10/11/2016  . Enlarged prostate 10/11/2016  . Lumbar degenerative disc disease 10/11/2016  . Ischemic cardiomyopathy 10/09/2016  . Chest pain 06/19/2016  . Dyspnea on exertion 05/13/2015  . Long term current use of anticoagulants with INR goal of 2.0-3.0 05/13/2015  . CKD (chronic kidney disease) stage 5, GFR less than 15 ml/min ([facility]) 01/13/2014    Past Surgical History:  Procedure Laterality Date  . CARDIAC DEFIBRILLATOR PLACEMENT    . CARDIAC SURGERY     Quad Bypass  . COLON SURGERY    . CORONARY ARTERY BYPASS GRAFT    . CORONARY ARTERY BYPASS GRAFT    . [place]  . KNEE SURGERY Bilateral   . LUNG LOBECTOMY Right   . PARTIAL COLECTOMY    . REPLACEMENT TOTAL KNEE Left  Social History   Tobacco Use  . Smoking status: Former Smoker    Packs/day: 3.00    Years:  40.00    Pack years: 120.00    Types: Cigarettes    Last attempt to quit: 08/20/2004    Years since quitting: 12.5  . Smokeless tobacco: Never Used  Substance Use Topics  . Alcohol use: No    Family History  Problem Relation Age of Onset  . Dementia Mother   . Diabetes Mother   . Stroke Mother   . Cancer Father        colon     Current medication list and allergy/intolerance information reviewed:    Current Outpatient Medications  Medication Sig Dispense Refill  . amiodarone (PACERONE) 200 MG tablet Take 200 mg by mouth daily.    [name] aspirin 81 MG chewable tablet Chew by mouth daily.    [name] atorvastatin (LIPITOR) 20 MG tablet Take 20 mg by mouth at bedtime.    . carvedilol (COREG) 6.25 MG tablet Take 3.125 mg by mouth 2 (two) times daily with a meal.     . Cholecalciferol (VITAMIN D3) 1000 units CAPS Vitamin D3 1,000 unit capsule  Take by oral route.    . gabapentin (NEURONTIN) 300 MG capsule Take 300 mg by mouth daily.    [name] linagliptin (TRADJENTA) 5 MG TABS tablet Take 5 mg by mouth daily.    . MULTIPLE VITAMIN PO Take by mouth.    . NONFORMULARY OR COMPOUNDED ITEM Home oxygen 3L per minute on nasal cannula 1 each 0  . OXYGEN Inhale 2 L into the lungs as needed.    . pantoprazole (PROTONIX) 20 MG tablet Take 40 mg by mouth daily.    . sevelamer carbonate (RENVELA) 800 MG tablet Take 800 mg by mouth 3 (three) times daily with meals.    . torsemide (DEMADEX) 100 MG tablet Take 50 mg by mouth. Take 1/2 tab PO on nondialysis days    . warfarin (COUMADIN) 5 MG tablet Take 1 tablet (5 mg total) by mouth as directed. Take as directed 90 tablet 0  . WARFARIN SODIUM PO Take 8 mg by mouth.    . WARFARIN SODIUM PO Take 7 mg by mouth.     No current facility-administered medications for this visit.     No Known Allergies    Review of Systems:  Constitutional:  No  fever, no chills, No recent illness,  HEENT: No  headache, no vision change  Cardiac: No  chest pain, No  pressure, No  palpitations  Respiratory:  No  shortness of breath. No  Cough  Gastrointestinal: No  abdominal pain  Musculoskeletal: No new myalgia/arthralgia  Skin: No  Rash  Neurologic: No new weakness, No  dizziness   Exam:  BP 118/62   Pulse 79   Temp 98.1 F (36.7 C) (Oral)   Wt 261 lb 1.9 oz (118.4 kg)   BMI 34.45 kg/m   Constitutional: VS see above. General Appearance: alert, well-developed, well-nourished, NAD  Eyes: Normal lids and conjunctive, non-icteric sclera  Ears, Nose, Mouth, Throat: MMM, Normal external inspection ears/nares/mouth/lips/gums.  Neck: No masses, trachea midline.   Respiratory: Normal respiratory effort.  Musculoskeletal:   Very poor posture, increased thoracic kyphosis, decreased lumbar lordosis. Atrophied musculature in gluteus area, quads, hamstrings. Limited core muscle strength, looks like some significant scar tissue previous surgeries.  (+)TART changes diffusely worse in the lumbar spine with limited rotation, flexion, extension. Positive muscle spasm and quadratus lumborum area  both sides.  Neurological: Normal balance/coordination. No tremor.   Skin: warm, dry, intact. No rash/ulcer.   Psychiatric: Normal judgment/insight. Normal mood and affect. Oriented x3.      ASSESSMENT/PLAN:   Lumbar degenerative disc disease - Advised OMT is helpful for some for relief of pain/symptoms but cannot correct the underlying disorder. Continue PT, core strengthening exercises  Somatic dysfunction of spine, lumbar - Myofascial release and muscle energy applied to positive patient relief.      Visit summary with medication list and pertinent instructions was printed for patient to review. All questions at time of visit were answered - patient instructed to contact office with any additional concerns. ER/RTC precautions were reviewed with the patient.   Follow-up plan: Return for repeat manipulations as needed if this was helpful .  Note: Total time  spent 25 minutes, greater than 50% of the visit was spent face-to-face counseling and coordinating care for the following: The primary encounter diagnosis was Lumbar degenerative disc disease. A diagnosis of Somatic dysfunction of spine, lumbar was also pertinent to this visit.[name]  Please note: voice recognition software was used to produce this document, and typos may escape review. Please contact Dr. Sheppard Coil for any needed clarifications.

## 2017-03-22 ENCOUNTER — Encounter: Payer: Self-pay | Admitting: Osteopathic Medicine

## 2017-03-25 ENCOUNTER — Telehealth: Payer: Self-pay

## 2017-03-25 NOTE — Telephone Encounter (Signed)
These medications should not interact with his Coumadin He needs routine INR check in 10 days (3 weeks from last INR check)

## 2017-03-25 NOTE — Telephone Encounter (Signed)
Pt wife notified of PA instructions. KG LPN

## 2017-03-25 NOTE — Telephone Encounter (Signed)
Wife reports that Veron was seen in urgent  Care over the weekend for productive cough.  She said that was is currently taking doxycyline 100 mg BID along with mucinex DM.  She is wanting to know when should his INR be checked again. Please advise. -EH/RMA

## 2017-03-26 ENCOUNTER — Encounter: Payer: Medicare Other | Admitting: Vascular Surgery

## 2017-03-27 ENCOUNTER — Ambulatory Visit (INDEPENDENT_AMBULATORY_CARE_PROVIDER_SITE_OTHER): Payer: Medicare Other | Admitting: Physician Assistant

## 2017-03-27 ENCOUNTER — Encounter: Payer: Self-pay | Admitting: Physician Assistant

## 2017-03-27 VITALS — BP 99/61 | HR 80 | Temp 98.0°F | Wt 251.0 lb

## 2017-03-27 DIAGNOSIS — Z8701 Personal history of pneumonia (recurrent): Secondary | ICD-10-CM

## 2017-03-27 DIAGNOSIS — J22 Unspecified acute lower respiratory infection: Secondary | ICD-10-CM

## 2017-03-27 MED ORDER — PREDNISONE 50 MG PO TABS: 50.0000 mg | ORAL_TABLET | Freq: Every day | ORAL | 0 refills | Status: DC

## 2017-03-27 MED ORDER — METHYLPREDNISOLONE SODIUM SUCC 125 MG IJ SOLR
250.0000 mg | Freq: Once | INTRAMUSCULAR | Status: AC
  Administered 2017-03-27: 250 mg via INTRAMUSCULAR

## 2017-03-27 MED ORDER — IPRATROPIUM-ALBUTEROL 0.5-2.5 (3) MG/3ML IN SOLN
3.0000 mL | Freq: Once | RESPIRATORY_TRACT | Status: AC
  Administered 2017-03-27: 3 mL via RESPIRATORY_TRACT

## 2017-03-27 MED ORDER — MOXIFLOXACIN HCL 400 MG PO TABS: 400.0000 mg | ORAL_TABLET | Freq: Every day | ORAL | 0 refills | Status: DC

## 2017-03-27 MED ORDER — GUAIFENESIN ER 600 MG PO TB12: 1200.0000 mg | ORAL_TABLET | Freq: Two times a day (BID) | ORAL | Status: DC

## 2017-03-27 NOTE — Progress Notes (Signed)
HPI:                                                                Justin Browning is a 72 y.o. male who presents to [place]: Primary Care [facility] today for cough  This is a 72 yo M with complex PMH of ESRD on dialysis, hx of adenocarcinoma of the lung, restrictive lung disease, CHF, Afib w/pacemaker, chronic anticoagulation, Type 2 DM who presents today with approximately 6 days of non-productive cough, worse at night, unchanged. He has also had some blood-tinged nasal drainage and a few nosebleeds that were easily controlled with pressure. Denies fever, chills, chest pain, hemoptysis, or worsening dyspnea from baseline. He was seen in urgent care 3 days ago and started on Doxycycline. Wife states medicaiton is not helping and she is concerned because he has a history of pneumonia. He goes to dialysis on M,W,F and went today. He has a CT chest planned tomorrow with his pulmonologist to assess for renal transplant eligibility.    Past Medical History:  Diagnosis Date  . Abdominal aortic aneurysm (AAA) without rupture ([place]) 01/31/2017  . Anemia of chronic disease 10/11/2016  . Aneurysm ([place])    multiple   . Atrial flutter ([place]) 09/23/2014  . CAD (coronary artery disease) 10/11/2016   Overview:  Non STEMI 1/06 Cardiac Catheterization 1/06-Total occlusion of the LAD,OM1,LCX,RPDA CABG 02/15/04(LIMA-LAD,SVG-OM1,SVG-OM2-OM3) Postoperative course complicated by recurrent ventricular and atrial arrhythmias.Acute abdomen requiring exploratory laparotomy, partial resection of the rectosigmoid colon, diverting colostomy s/p revision. AICD 2/06  . Chronic atrial fibrillation ([place]) 10/11/2016  . Chronic congestive heart failure ([place]) 10/11/2016  . Diabetes mellitus without complication ([place])   . Dyspnea on exertion 05/13/2015  . ESRD (end stage renal disease) on dialysis ([place])   . ESRD on dialysis ([place]) 10/11/2016  . Hernia of abdominal cavity   . History of adenocarcinoma of lung  10/11/2016   s/p partial lobectomy right lung  . Hyperlipidemia   . Ischemic cardiomyopathy 10/09/2016  . Lumbar degenerative disc disease 10/11/2016  . Mixed hyperlipidemia 10/11/2016  . Sleep apnea   . Thrombocytopenia ([place]) 10/11/2016  . Type 2 diabetes mellitus with chronic kidney disease on chronic dialysis, without long-term current use of insulin ([place]) 10/11/2016  . VT (ventricular tachycardia) ([place]) 05/12/2010   Overview:  AICD 03/09/04 [employer]   Past Surgical History:  Procedure Laterality Date  . CARDIAC DEFIBRILLATOR PLACEMENT    . CARDIAC SURGERY     Quad Bypass  . COLON SURGERY    . CORONARY ARTERY BYPASS GRAFT    . CORONARY ARTERY BYPASS GRAFT    . [place]  . KNEE SURGERY Bilateral   . LUNG LOBECTOMY Right   . PARTIAL COLECTOMY    . REPLACEMENT TOTAL KNEE Left    Social History   Tobacco Use  . Smoking status: Former Smoker    Packs/day: 3.00    Years: 40.00    Pack years: 120.00    Types: Cigarettes    Last attempt to quit: 08/20/2004    Years since quitting: 12.6  . Smokeless tobacco: Never Used  Substance Use Topics  . Alcohol use: No   family history includes Cancer in his father; Dementia in his mother; Diabetes in his  mother; Stroke in his mother.    ROS: negative except as noted in the HPI  Medications: Current Outpatient Medications  Medication Sig Dispense Refill  . amiodarone (PACERONE) 200 MG tablet Take 200 mg by mouth daily.    [name] aspirin 81 MG chewable tablet Chew by mouth daily.    [name] atorvastatin (LIPITOR) 20 MG tablet Take 20 mg by mouth at bedtime.    . carvedilol (COREG) 6.25 MG tablet Take 3.125 mg by mouth 2 (two) times daily with a meal.     . Cholecalciferol (VITAMIN D3) 1000 units CAPS Vitamin D3 1,000 unit capsule  Take by oral route.    . doxycycline (VIBRAMYCIN) 100 MG capsule Take 100 mg by mouth 2 (two) times daily.    [name] gabapentin (NEURONTIN) 300 MG capsule Take 300 mg by mouth daily.    [name] linagliptin  (TRADJENTA) 5 MG TABS tablet Take 5 mg by mouth daily.    . MULTIPLE VITAMIN PO Take by mouth.    . NONFORMULARY OR COMPOUNDED ITEM Home oxygen 3L per minute on nasal cannula 1 each 0  . OXYGEN Inhale 2 L into the lungs as needed.    . pantoprazole (PROTONIX) 20 MG tablet Take 40 mg by mouth daily.    . sevelamer carbonate (RENVELA) 800 MG tablet Take 800 mg by mouth 3 (three) times daily with meals.    . torsemide (DEMADEX) 100 MG tablet Take 50 mg by mouth. Take 1/2 tab PO on nondialysis days    . warfarin (COUMADIN) 5 MG tablet Take 1 tablet (5 mg total) by mouth as directed. Take as directed 90 tablet 0  . WARFARIN SODIUM PO Take 8 mg by mouth.    . WARFARIN SODIUM PO Take 7 mg by mouth.    [name] guaiFENesin (MUCINEX) 600 MG 12 hr tablet Take 2 tablets (1,200 mg total) by mouth 2 (two) times daily.    [name] moxifloxacin (AVELOX) 400 MG tablet Take 1 tablet (400 mg total) by mouth daily. 7 tablet 0  . predniSONE (DELTASONE) 50 MG tablet Take 1 tablet (50 mg total) by mouth daily. 5 tablet 0   Current Facility-Administered Medications  Medication Dose Route Frequency Provider Last Rate Last Dose  . ipratropium-albuterol (DUONEB) 0.5-2.5 (3) MG/3ML nebulizer solution 3 mL  3 mL Nebulization Once Trixie Dredge, PA-C       No Known Allergies     Objective:  BP 99/61   Pulse 80   Temp 98 F (36.7 C) (Oral)   Wt 251 lb (113.9 kg)   SpO2 94%   BMI 33.12 kg/m  Gen:  alert, not ill-appearing, no distress, appropriate for age, obese male seated in a wheelchair HEENT: head normocephalic without obvious abnormality, conjunctiva and cornea clear, TM's clear bilaterally, nares with dried blood bilaterally, nasal mucosa edematous, oropharynx clear, no erythema or edema, neck supple, no cervical adenopathy, trachea midline Pulm: Normal work of breathing, normal phonation, inspiratory wheeze, breath sounds are diffusely coarse  CV: Normal rate, regular rhythm, s1 and s2 distinct, no  murmurs, clicks or rubs  Neuro: alert and oriented x 3, no tremor MSK: extremities atraumatic, antalgic shuffling gait Skin: intact, no rashes on exposed skin, no jaundice, no cyanosis     No results found for this or any previous visit (from the past 72 hour(s)). No results found.    Assessment and Plan: 72 y.o. male with   1. Acute lower respiratory infection - SpO02 94% on RA at rest, which  is actually better than his baseline of 88-90%. Vitals are stable. I am going to treat him aggressively because he is at risk for HCAP due to dialysis and immunosuppressed status. Duoneb and Solumedrol given in office today. Switching from Doxycyline to Avelox. Steroid burst, starting tomorrow. - ipratropium-albuterol (DUONEB) 0.5-2.5 (3) MG/3ML nebulizer solution 3 mL - moxifloxacin (AVELOX) 400 MG tablet; Take 1 tablet (400 mg total) by mouth daily.  Dispense: 7 tablet; Refill: 0 - predniSONE (DELTASONE) 50 MG tablet; Take 1 tablet (50 mg total) by mouth daily.  Dispense: 5 tablet; Refill: 0 - guaiFENesin (MUCINEX) 600 MG 12 hr tablet; Take 2 tablets (1,200 mg total) by mouth 2 (two) times daily. - methylPREDNISolone sodium succinate (SOLU-MEDROL) 125 mg/2 mL injection 250 mg  2. History of pneumonia     Patient education and anticipatory guidance given Patient agrees with treatment plan Follow-up in 2 days for cough or sooner as needed if symptoms worsen or fail to improve  Darlyne Russian PA-C

## 2017-03-27 NOTE — Patient Instructions (Addendum)
-   stop Doxycycline - start Moxifloxacin 1 tablet daily beginning tonight - start Prednisone tomorrow, 1 tablet daily for 5 days - okay to continue Mucinex but it should be plain Guafenesin  - follow-up w/Dr. T on Friday

## 2017-03-29 ENCOUNTER — Ambulatory Visit (INDEPENDENT_AMBULATORY_CARE_PROVIDER_SITE_OTHER): Payer: Medicare Other | Admitting: Sports Medicine

## 2017-03-29 ENCOUNTER — Encounter: Payer: Self-pay | Admitting: Sports Medicine

## 2017-03-29 ENCOUNTER — Encounter: Payer: Self-pay | Admitting: Physician Assistant

## 2017-03-29 DIAGNOSIS — R059 Cough, unspecified: Secondary | ICD-10-CM

## 2017-03-29 DIAGNOSIS — R05 Cough: Secondary | ICD-10-CM

## 2017-03-29 DIAGNOSIS — R053 Chronic cough: Secondary | ICD-10-CM | POA: Insufficient documentation

## 2017-03-29 DIAGNOSIS — Z8701 Personal history of pneumonia (recurrent): Secondary | ICD-10-CM | POA: Insufficient documentation

## 2017-03-29 NOTE — Assessment & Plan Note (Signed)
Currently resolved after antibiotics and steroids, chest CT negative for pneumonia or any evidence of lung cancer, primary or metastatic. Follow-up with PCP as previously directed, no further follow-up needed with me.

## 2017-03-29 NOTE — Progress Notes (Signed)
Subjective:    CC: Follow-up  HPI: Justin Browning returns, he has a complex medical history, he was seen by Nelson Chimes, PA-C recently, suspected pneumonia with cough, he was appropriately started on Avelox for HCAP, steroids, he returns today essentially symptom-free.  He did have a recent chest CT yesterday for follow-up of lung cancer post lobectomy, it is clear of any pneumonia, or metastatic or primary neoplasia.  I reviewed the past medical history, family history, social history, surgical history, and allergies today and no changes were needed.  Please see the problem list section below in epic for further details.  Past Medical History: Past Medical History:  Diagnosis Date  . Abdominal aortic aneurysm (AAA) without rupture ([place]) 01/31/2017  . Anemia of chronic disease 10/11/2016  . Aneurysm ([place])    multiple   . Atrial flutter ([place]) 09/23/2014  . CAD (coronary artery disease) 10/11/2016   Overview:  Non STEMI 1/06 Cardiac Catheterization 1/06-Total occlusion of the LAD,OM1,LCX,RPDA CABG 02/15/04(LIMA-LAD,SVG-OM1,SVG-OM2-OM3) Postoperative course complicated by recurrent ventricular and atrial arrhythmias.Acute abdomen requiring exploratory laparotomy, partial resection of the rectosigmoid colon, diverting colostomy s/p revision. AICD 2/06  . Chronic atrial fibrillation ([place]) 10/11/2016  . Chronic congestive heart failure ([place]) 10/11/2016  . Diabetes mellitus without complication ([place])   . Dyspnea on exertion 05/13/2015  . ESRD (end stage renal disease) on dialysis ([place])   . ESRD on dialysis ([place]) 10/11/2016  . Hernia of abdominal cavity   . History of adenocarcinoma of lung 10/11/2016   s/p partial lobectomy right lung  . Hyperlipidemia   . Ischemic cardiomyopathy 10/09/2016  . Lumbar degenerative disc disease 10/11/2016  . Mixed hyperlipidemia 10/11/2016  . Sleep apnea   . Thrombocytopenia ([place]) 10/11/2016  . Type 2 diabetes mellitus with chronic kidney disease on chronic dialysis, without  long-term current use of insulin ([place]) 10/11/2016  . VT (ventricular tachycardia) ([place]) 05/12/2010   Overview:  AICD 03/09/04 [employer]   Past Surgical History: Past Surgical History:  Procedure Laterality Date  . CARDIAC DEFIBRILLATOR PLACEMENT    . CARDIAC SURGERY     Quad Bypass  . COLON SURGERY    . CORONARY ARTERY BYPASS GRAFT    . CORONARY ARTERY BYPASS GRAFT    . [place]  . KNEE SURGERY Bilateral   . LUNG LOBECTOMY Right   . PARTIAL COLECTOMY    . REPLACEMENT TOTAL KNEE Left    Social History: Social History   Socioeconomic History  . Marital status: Married    Spouse name: None  . Number of children: None  . Years of education: None  . Highest education level: None  Social Needs  . Financial resource strain: None  . Food insecurity - worry: None  . Food insecurity - inability: None  . Transportation needs - medical: None  . Transportation needs - non-medical: None  Occupational History  . None  Tobacco Use  . Smoking status: Former Smoker    Packs/day: 3.00    Years: 40.00    Pack years: 120.00    Types: Cigarettes    Last attempt to quit: 08/20/2004    Years since quitting: 12.6  . Smokeless tobacco: Never Used  Substance and Sexual Activity  . Alcohol use: No  . Drug use: No  . Sexual activity: Not Currently  Other Topics Concern  . None  Social History Narrative  . None   Family History: Family History  Problem Relation Age of Onset  . Dementia Mother   . Diabetes  Mother   . Stroke Mother   . Cancer Father        colon   Allergies: No Known Allergies Medications: See med rec.  Review of Systems: No fevers, chills, night sweats, weight loss, chest pain, or shortness of breath.   Objective:    General: Well Developed, well nourished, and in no acute distress.  Neuro: Alert and oriented x3, extra-ocular muscles intact, sensation grossly intact.  HEENT: Normocephalic, atraumatic, pupils equal round reactive to light, neck  supple, no masses, no lymphadenopathy, thyroid nonpalpable.  Skin: Warm and dry, no rashes. Cardiac: Regular rate and rhythm, no murmurs rubs or gallops, no lower extremity edema.  Respiratory: Clear to auscultation bilaterally. Not using accessory muscles, speaking in full sentences.  Impression and Recommendations:    Cough Currently resolved after antibiotics and steroids, chest CT negative for pneumonia or any evidence of lung cancer, primary or metastatic. Follow-up with PCP as previously directed, no further follow-up needed with me. ___________________________________________ Gwen Her. Dianah Field, M.D., ABFM., CAQSM. Primary Care and [place] In[employer] of [employer] [facility] [employer] [facility]

## 2017-04-04 ENCOUNTER — Ambulatory Visit (INDEPENDENT_AMBULATORY_CARE_PROVIDER_SITE_OTHER): Payer: Medicare Other | Admitting: Physician Assistant

## 2017-04-04 ENCOUNTER — Telehealth: Payer: Self-pay

## 2017-04-04 VITALS — BP 118/58 | HR 74 | Temp 97.7°F | Resp 18 | Wt 260.8 lb

## 2017-04-04 DIAGNOSIS — R791 Abnormal coagulation profile: Secondary | ICD-10-CM

## 2017-04-04 DIAGNOSIS — I480 Paroxysmal atrial fibrillation: Secondary | ICD-10-CM

## 2017-04-04 LAB — POCT INR: INR: 4.2

## 2017-04-04 NOTE — Telephone Encounter (Signed)
Called [facility] - spoke to [place] - requested INR be processed as STAT order instead on normal. LaTonya advised specimen has not been received yet - will put the request is now to be seen by lab once they receive specimen.

## 2017-04-04 NOTE — Progress Notes (Signed)
Called patient - spoke to wife - per provider advised of new directions for taking medication:  Hold tonight's dose. 8 mg on Tuesdays 7 mg all other days  Wife verbally repeated new directions  - she will call on Friday to schedule Coumadin Clinic with nurse for 04/11/17.

## 2017-04-05 LAB — PROTIME-INR
INR: 3.1 — ABNORMAL HIGH
Prothrombin Time: 32.3 s — ABNORMAL HIGH (ref 9.0–11.5)

## 2017-04-05 NOTE — Progress Notes (Signed)
Lab INR was much lower, 3.1 Recommend he resume his prior Warfarin dosing schedule of 8 mg on Tuesdays and Thursdays, and 7 mg all other days It is okay if he held his dose yesterday. Just return to normal schedule beginning today. Return for recheck in about 1 week

## 2017-04-10 ENCOUNTER — Other Ambulatory Visit: Payer: Self-pay | Admitting: Physician Assistant

## 2017-04-10 DIAGNOSIS — M150 Primary generalized (osteo)arthritis: Secondary | ICD-10-CM

## 2017-04-10 DIAGNOSIS — R29898 Other symptoms and signs involving the musculoskeletal system: Secondary | ICD-10-CM

## 2017-04-10 DIAGNOSIS — Z7901 Long term (current) use of anticoagulants: Secondary | ICD-10-CM

## 2017-04-10 DIAGNOSIS — I480 Paroxysmal atrial fibrillation: Secondary | ICD-10-CM

## 2017-04-10 DIAGNOSIS — M8949 Other hypertrophic osteoarthropathy, multiple sites: Secondary | ICD-10-CM

## 2017-04-10 DIAGNOSIS — M159 Polyosteoarthritis, unspecified: Secondary | ICD-10-CM

## 2017-04-10 MED ORDER — WARFARIN SODIUM 5 MG PO TABS: ORAL_TABLET | ORAL | 0 refills | Status: DC

## 2017-04-11 ENCOUNTER — Ambulatory Visit: Payer: Medicare Other

## 2017-04-11 ENCOUNTER — Other Ambulatory Visit: Payer: Self-pay

## 2017-04-11 ENCOUNTER — Other Ambulatory Visit: Payer: Self-pay | Admitting: Physician Assistant

## 2017-04-11 DIAGNOSIS — I480 Paroxysmal atrial fibrillation: Secondary | ICD-10-CM

## 2017-04-11 DIAGNOSIS — Z7901 Long term (current) use of anticoagulants: Secondary | ICD-10-CM

## 2017-04-11 LAB — PROTIME-INR
INR: 1.9 — ABNORMAL HIGH
Prothrombin Time: 20.1 s — ABNORMAL HIGH (ref 9.0–11.5)

## 2017-04-11 MED ORDER — APIXABAN 5 MG PO TABS
5.0000 mg | ORAL_TABLET | Freq: Two times a day (BID) | ORAL | 5 refills | Status: DC
Start: 2017-04-11 — End: 2017-09-18

## 2017-04-11 NOTE — Progress Notes (Signed)
Wife notified -EH/RMA  

## 2017-04-11 NOTE — Progress Notes (Signed)
Please let Mccabe know that I have sent in Eliquis If he can start it today, that would be ideal. He just needs to stop Coumadin today and take first dose of Eliquis. If he is unable to start today, we will need to make sure his INR is below 2.0 before starting it.  Have them inform us when he has filled the prescription and we can go from there.

## 2017-04-12 ENCOUNTER — Telehealth: Payer: Self-pay

## 2017-04-12 NOTE — Telephone Encounter (Signed)
Wife notified -EH/RMA  

## 2017-04-12 NOTE — Telephone Encounter (Signed)
Wife stated that Justin Browning started the eliquis last night.  She is wanting to know when should his INR be rechecked. Please advise.  -EH/RMA

## 2017-04-12 NOTE — Telephone Encounter (Signed)
Make sure they have stopped Coumadin We do not need to check an INR again He just needs to continue the Eliquis twice a day

## 2017-04-30 ENCOUNTER — Other Ambulatory Visit: Payer: Self-pay

## 2017-04-30 DIAGNOSIS — N186 End stage renal disease: Principal | ICD-10-CM

## 2017-04-30 DIAGNOSIS — E1122 Type 2 diabetes mellitus with diabetic chronic kidney disease: Secondary | ICD-10-CM

## 2017-04-30 DIAGNOSIS — Z992 Dependence on renal dialysis: Principal | ICD-10-CM

## 2017-04-30 MED ORDER — LINAGLIPTIN 5 MG PO TABS: 5.0000 mg | ORAL_TABLET | Freq: Every day | ORAL | 0 refills | Status: DC

## 2017-05-10 ENCOUNTER — Other Ambulatory Visit: Payer: Self-pay | Admitting: Physician Assistant

## 2017-05-10 DIAGNOSIS — I482 Chronic atrial fibrillation, unspecified: Secondary | ICD-10-CM

## 2017-05-14 ENCOUNTER — Encounter: Payer: Self-pay | Admitting: Physician Assistant

## 2017-05-14 ENCOUNTER — Ambulatory Visit (INDEPENDENT_AMBULATORY_CARE_PROVIDER_SITE_OTHER): Payer: Medicare Other | Admitting: Physician Assistant

## 2017-05-14 ENCOUNTER — Encounter: Payer: Medicare Other | Admitting: Vascular Surgery

## 2017-05-14 ENCOUNTER — Ambulatory Visit (INDEPENDENT_AMBULATORY_CARE_PROVIDER_SITE_OTHER): Payer: Medicare Other

## 2017-05-14 VITALS — BP 120/68 | HR 80 | Temp 97.6°F | Wt 258.0 lb

## 2017-05-14 DIAGNOSIS — R0609 Other forms of dyspnea: Secondary | ICD-10-CM | POA: Diagnosis not present

## 2017-05-14 DIAGNOSIS — R0989 Other specified symptoms and signs involving the circulatory and respiratory systems: Secondary | ICD-10-CM

## 2017-05-14 DIAGNOSIS — R05 Cough: Secondary | ICD-10-CM | POA: Diagnosis not present

## 2017-05-14 DIAGNOSIS — Z85118 Personal history of other malignant neoplasm of bronchus and lung: Secondary | ICD-10-CM

## 2017-05-14 DIAGNOSIS — J984 Other disorders of lung: Secondary | ICD-10-CM | POA: Diagnosis not present

## 2017-05-14 DIAGNOSIS — J90 Pleural effusion, not elsewhere classified: Secondary | ICD-10-CM

## 2017-05-14 DIAGNOSIS — R058 Other specified cough: Secondary | ICD-10-CM

## 2017-05-14 DIAGNOSIS — R0600 Dyspnea, unspecified: Secondary | ICD-10-CM

## 2017-05-14 MED ORDER — DOXYCYCLINE HYCLATE 100 MG PO TABS: 100.0000 mg | ORAL_TABLET | Freq: Two times a day (BID) | ORAL | 0 refills | Status: AC

## 2017-05-14 MED ORDER — ALBUTEROL SULFATE HFA 108 (90 BASE) MCG/ACT IN AERS: 1.0000 | INHALATION_SPRAY | RESPIRATORY_TRACT | 0 refills | Status: DC | PRN

## 2017-05-14 MED ORDER — IPRATROPIUM-ALBUTEROL 0.5-2.5 (3) MG/3ML IN SOLN
3.0000 mL | Freq: Once | RESPIRATORY_TRACT | Status: AC
  Administered 2017-05-14: 3 mL via RESPIRATORY_TRACT

## 2017-05-14 MED ORDER — PREDNISONE 50 MG PO TABS
50.0000 mg | ORAL_TABLET | Freq: Every day | ORAL | 0 refills | Status: DC
Start: 2017-05-14 — End: 2017-06-18

## 2017-05-14 NOTE — Progress Notes (Signed)
HPI:                                                                Justin Browning is a 72 y.o. male who presents to [place]: Primary Care [facility] Medicine today for cough  This is a pleasant 72 yo M with complex PMH of ESRD on dialysis, afib on Eliquis, hx of adenocarcinoma of the lung s/p RLL lobectomy, CHF, CAD s/p stent, ICD, DM 2, ACD who presents with 4 days of cough, chest congestion, cold chills and tactile temperature beginning on Saturday evening. Cough is productive of white-yellow sputum. No change in baseline shortness of breath. No chest pain. No peripheral edema. He has noticed more rattling in his anterior chest. Denies sick contacts or recent travel.   He is followed by Pulmonology (Dr. Michela Pitcher) for his history of lung cancer. Surveillance chest CT on 03/28/17 was negative for recurrence. Spirometry  from January 2019 revealed The FEV1 and FVC are moderately reduced.The total lung capacity is moderately reduced.The diffusing capacity is severly reduced.There is a moderate restrictive lung defect.  He has home oxygen left over from prior physician in [place]. He uses this haphazardly as needed.  Past Medical History:  Diagnosis Date  . Abdominal aortic aneurysm (AAA) without rupture ([place]) 01/31/2017  . Anemia of chronic disease 10/11/2016  . Aneurysm ([place])    multiple   . Atrial flutter ([place]) 09/23/2014  . CAD (coronary artery disease) 10/11/2016   Overview:  Non STEMI 1/06 Cardiac Catheterization 1/06-Total occlusion of the LAD,OM1,LCX,RPDA CABG 02/15/04(LIMA-LAD,SVG-OM1,SVG-OM2-OM3) Postoperative course complicated by recurrent ventricular and atrial arrhythmias.Acute abdomen requiring exploratory laparotomy, partial resection of the rectosigmoid colon, diverting colostomy s/p revision. AICD 2/06  . Chronic atrial fibrillation ([place]) 10/11/2016  . Chronic congestive heart failure ([place]) 10/11/2016  . Diabetes mellitus without complication ([place])   . Dyspnea on  exertion 05/13/2015  . ESRD (end stage renal disease) on dialysis ([place])   . ESRD on dialysis ([place]) 10/11/2016  . Hernia of abdominal cavity   . History of adenocarcinoma of lung 10/11/2016   s/p partial lobectomy right lung  . Hyperlipidemia   . Ischemic cardiomyopathy 10/09/2016  . Lumbar degenerative disc disease 10/11/2016  . Mixed hyperlipidemia 10/11/2016  . Sleep apnea   . Thrombocytopenia ([place]) 10/11/2016  . Type 2 diabetes mellitus with chronic kidney disease on chronic dialysis, without long-term current use of insulin ([place]) 10/11/2016  . VT (ventricular tachycardia) ([place]) 05/12/2010   Overview:  AICD 03/09/04 [employer]   Past Surgical History:  Procedure Laterality Date  . CARDIAC DEFIBRILLATOR PLACEMENT    . CARDIAC SURGERY     Quad Bypass  . COLON SURGERY    . CORONARY ARTERY BYPASS GRAFT    . CORONARY ARTERY BYPASS GRAFT    . [place]  . KNEE SURGERY Bilateral   . LUNG LOBECTOMY Right   . PARTIAL COLECTOMY    . REPLACEMENT TOTAL KNEE Left    Social History   Tobacco Use  . Smoking status: Former Smoker    Packs/day: 3.00    Years: 40.00    Pack years: 120.00    Types: Cigarettes    Last attempt to quit: 08/20/2004    Years since quitting: 12.7  . Smokeless  tobacco: Never Used  Substance Use Topics  . Alcohol use: No   family history includes Cancer in his father; Dementia in his mother; Diabetes in his mother; Stroke in his mother.    ROS: negative except as noted in the HPI  Medications: Current Outpatient Medications  Medication Sig Dispense Refill  . amiodarone (PACERONE) 200 MG tablet Take 200 mg by mouth daily.    [name] apixaban (ELIQUIS) 5 MG TABS tablet Take 1 tablet (5 mg total) by mouth 2 (two) times daily. 60 tablet 5  . aspirin 81 MG chewable tablet Chew by mouth daily.    [name] atorvastatin (LIPITOR) 20 MG tablet Take 20 mg by mouth at bedtime.    . carvedilol (COREG) 6.25 MG tablet Take 3.125 mg by mouth 2 (two) times daily with a meal.      . Cholecalciferol (VITAMIN D3) 1000 units CAPS Vitamin D3 1,000 unit capsule  Take by oral route.    . gabapentin (NEURONTIN) 300 MG capsule Take 300 mg by mouth daily.    [name] linagliptin (TRADJENTA) 5 MG TABS tablet Take 1 tablet (5 mg total) by mouth daily. 30 tablet 0  . MULTIPLE VITAMIN PO Take by mouth.    . NONFORMULARY OR COMPOUNDED ITEM Home oxygen 3L per minute on nasal cannula 1 each 0  . OXYGEN Inhale 2 L into the lungs as needed.    . pantoprazole (PROTONIX) 20 MG tablet Take 40 mg by mouth daily.    . sevelamer carbonate (RENVELA) 800 MG tablet Take 800 mg by mouth 3 (three) times daily with meals.    . torsemide (DEMADEX) 100 MG tablet Take 50 mg by mouth. Take 1/2 tab PO on nondialysis days    . albuterol (PROVENTIL HFA;VENTOLIN HFA) 108 (90 Base) MCG/ACT inhaler Inhale 1-2 puffs into the lungs every 4 (four) hours as needed for wheezing or shortness of breath (bronchospasm). 1 Inhaler 0  . doxycycline (VIBRA-TABS) 100 MG tablet Take 1 tablet (100 mg total) by mouth 2 (two) times daily for 7 days. 14 tablet 0  . predniSONE (DELTASONE) 50 MG tablet Take 1 tablet (50 mg total) by mouth daily. 5 tablet 0   No current facility-administered medications for this visit.    No Known Allergies     Objective:  BP 120/68   Pulse 80   Temp 97.6 F (36.4 C) (Oral)   Wt 258 lb (117 kg)   SpO2 93%   BMI 34.04 kg/m  Gen:  alert, not ill-appearing, no distress, appropriate for age, obese male HEENT: head normocephalic without obvious abnormality, conjunctiva and cornea clear, wearing glasses, trachea midline Pulm: Normal work of breathing, normal phonation, breath sounds are diffusely coarse with expiratory rhonchi  CV: Normal rate, regular rhythm, s1 and s2 distinct, no murmurs, clicks or rubs  Neuro: alert and oriented x 3, no tremor MSK: fistula right arm, extremities atraumatic, ambulating with walker, 1+ peripheral edema Skin: intact, no rashes on exposed skin, no jaundice,  no cyanosis   No results found for this or any previous visit (from the past 72 hour(s)). Dg Chest 2 View  Addendum Date: 05/14/2017   ADDENDUM REPORT: 05/14/2017 15:49 ADDENDUM: Per ordering clinician, patient is status post RIGHT lower lobectomy for lung cancer. Small RIGHT pleural effusion, likely without subpulmonic component. Electronically Signed   By: Elon Alas M.D.   On: 05/14/2017 15:49   Result Date: 05/14/2017 CLINICAL DATA:  Cough, dizziness and weakness. EXAM: CHEST - 2 VIEW COMPARISON:  None.  FINDINGS: Cardiac silhouette is moderately enlarged. Tortuous calcified aorta. Status post median sternotomy for CABG. Dual lead LEFT cardiac defibrillator tips projecting RIGHT atrium and RIGHT ventricle. Elevated RIGHT hemidiaphragm with blunting of the RIGHT costophrenic angle. Strandy densities RIGHT lung base. No pneumothorax. Soft tissue planes and included osseous structures are nonsuspicious. IMPRESSION: Small to moderate RIGHT pleural effusion with suspected subpulmonic component. RIGHT lung base atelectasis. Moderate cardiomegaly. Aortic Atherosclerosis (ICD10-I70.0). Electronically Signed: By: Elon Alas M.D. On: 05/14/2017 15:37      Assessment and Plan: 72 y.o. male with complex PMH of ESRD on dialysis, afib on Eliquis, hx of adenocarcinoma of the lung s/p RLL lobectomy, CHF, CAD s/p stent, ICD, DM 2, ACD  Productive cough - diffuse rhonchi, SpO2 93% on RA at rest. Personally reviewed CXR with radiologist, which shows a small right pleural effusion, otherwise unremarkable. Treating aggressively due to patient's co-morbidities with Doxy and steroid burst. He felt symptomatic improvement with DuoNeb in office today, so will treat for acute bronchitis with albuterol prn. Explained that this will not improve his chronic DOE/pulmonary disease. Close follow-up in 2 days  Dyspnea on Exertion - we discussed the etiology of this. Combination of his ACD (Hgb 10.6) and  restrictive lung disease  Patient desaturates to 84% with exertion. He refuses home oxygen therapy.  Keep follow-up appts with Pulmonology  Cough productive of yellow sputum - Plan: DG Chest 2 View, albuterol (PROVENTIL HFA;VENTOLIN HFA) 108 (90 Base) MCG/ACT inhaler, ipratropium-albuterol (DUONEB) 0.5-2.5 (3) MG/3ML nebulizer solution 3 mL  [place] - Plan: DG Chest 2 View, albuterol (PROVENTIL HFA;VENTOLIN HFA) 108 (90 Base) MCG/ACT inhaler, ipratropium-albuterol (DUONEB) 0.5-2.5 (3) MG/3ML nebulizer solution 3 mL  Restrictive lung disease - Plan: CANCELED: Ambulatory referral to Sleep Studies  Dyspnea on exertion - due to combination of anemia of chronic disease (Hgb 10.6) and restrictive lung disease  - Plan: CANCELED: Ambulatory referral to Sleep Studies   Patient education and anticipatory guidance given Patient agrees with treatment plan Follow-up in 2 days for cough or sooner as needed if symptoms worsen or fail to improve  Darlyne Russian PA-C

## 2017-05-14 NOTE — Patient Instructions (Signed)
You are short of breath for 2 big reasons:  1. You have anemia (low hemoglobin) due to your kidney disease. This means your red blood cells do not carry enough oxygen to supply your tissues when you exert yourself 2. You have restrictive lung disease. This means your lung capacity is diminished. This is likely due to your history of lung cancer and lobectomy And you now have a pulmonary infection, which is not helping either of these issues.  I recommend that you wear home oxygen whenever you are not at rest and when your Home SpO2 drops below 88% We need to get you set up with a home oxygen supplier here. We use [employer]. Please bring your oxygen machine to your follow-up appointment

## 2017-05-16 ENCOUNTER — Encounter: Payer: Self-pay | Admitting: Physician Assistant

## 2017-05-16 ENCOUNTER — Ambulatory Visit (INDEPENDENT_AMBULATORY_CARE_PROVIDER_SITE_OTHER): Payer: Medicare Other | Admitting: Physician Assistant

## 2017-05-16 ENCOUNTER — Ambulatory Visit: Payer: Medicare Other | Admitting: Physician Assistant

## 2017-05-16 VITALS — BP 119/63 | HR 75 | Temp 98.1°F | Wt 260.0 lb

## 2017-05-16 DIAGNOSIS — J22 Unspecified acute lower respiratory infection: Secondary | ICD-10-CM | POA: Diagnosis not present

## 2017-05-16 DIAGNOSIS — J90 Pleural effusion, not elsewhere classified: Secondary | ICD-10-CM | POA: Diagnosis not present

## 2017-05-16 NOTE — Progress Notes (Signed)
HPI:                                                                Justin Browning is a 72 y.o. male who presents to [facility] today for follow-up of lower respiratory infection  This is a pleasant 73 yo M with complex PMH of ESRD on dialysis, afib on Eliquis, hx of adenocarcinoma of the lung s/p RLL lobectomy, CHF, CAD s/p stent, ICD, DM 2, ACD. He was seen in the office 2 days ago for productive cough, wheezing and chills x 4 days. CXR revealed a small pleural effusion. He was hypoxic at 83% with exertion and 89-92% on RA at rest. He is not compliant with his oxygen. He was treated aggressively with steroid burst and doxycycline.  Reports he is feeling "90% improved." Cough is loosening up and he is coughing up yellow sputum. Denies chest pain, hemoptysis, worsening dyspnea. Reports home SpO2 readings are 88-93% on RA at rest.  He is followed by Pulmonology (Dr. Michela Pitcher) for his history of lung cancer. Surveillance chest CT on 03/28/17 was negative for recurrence. Spirometry  from January 2019 revealed The FEV1 and FVC are moderately reduced.The total lung capacity is moderately reduced.The diffusing capacity is severly reduced.There is a moderate restrictive lung defect.   Past Medical History:  Diagnosis Date  . Abdominal aortic aneurysm (AAA) without rupture ([place]) 01/31/2017  . Anemia of chronic disease 10/11/2016  . Aneurysm ([place])    multiple   . Atrial flutter ([place]) 09/23/2014  . CAD (coronary artery disease) 10/11/2016   Overview:  Non STEMI 1/06 Cardiac Catheterization 1/06-Total occlusion of the LAD,OM1,LCX,RPDA CABG 02/15/04(LIMA-LAD,SVG-OM1,SVG-OM2-OM3) Postoperative course complicated by recurrent ventricular and atrial arrhythmias.Acute abdomen requiring exploratory laparotomy, partial resection of the rectosigmoid colon, diverting colostomy s/p revision. AICD 2/06  . Chronic atrial fibrillation ([place]) 10/11/2016  . Chronic  congestive heart failure ([place]) 10/11/2016  . Diabetes mellitus without complication ([place])   . Dyspnea on exertion 05/13/2015  . ESRD (end stage renal disease) on dialysis ([place])   . ESRD on dialysis ([place]) 10/11/2016  . Hernia of abdominal cavity   . History of adenocarcinoma of lung 10/11/2016   s/p partial lobectomy right lung  . Hyperlipidemia   . Ischemic cardiomyopathy 10/09/2016  . Lumbar degenerative disc disease 10/11/2016  . Mixed hyperlipidemia 10/11/2016  . Sleep apnea   . Thrombocytopenia ([place]) 10/11/2016  . Type 2 diabetes mellitus with chronic kidney disease on chronic dialysis, without long-term current use of insulin ([place]) 10/11/2016  . VT (ventricular tachycardia) ([place]) 05/12/2010   Overview:  AICD 03/09/04 [employer]   Past Surgical History:  Procedure Laterality Date  . CARDIAC DEFIBRILLATOR PLACEMENT    . CARDIAC SURGERY     Quad Bypass  . COLON SURGERY    . CORONARY ARTERY BYPASS GRAFT    . CORONARY ARTERY BYPASS GRAFT    . [place]  . KNEE SURGERY Bilateral   . LUNG LOBECTOMY Right   . PARTIAL COLECTOMY    . REPLACEMENT TOTAL KNEE Left    Social History   Tobacco Use  . Smoking status: Former Smoker    Packs/day: 3.00    Years: 40.00    Pack years: 120.00  Types: Cigarettes    Last attempt to quit: 08/20/2004    Years since quitting: 12.7  . Smokeless tobacco: Never Used  Substance Use Topics  . Alcohol use: No   family history includes Cancer in his father; Dementia in his mother; Diabetes in his mother; Stroke in his mother.    ROS: negative except as noted in the HPI  Medications: Current Outpatient Medications  Medication Sig Dispense Refill  . albuterol (PROVENTIL HFA;VENTOLIN HFA) 108 (90 Base) MCG/ACT inhaler Inhale 1-2 puffs into the lungs every 4 (four) hours as needed for wheezing or shortness of breath (bronchospasm). 1 Inhaler 0  . amiodarone (PACERONE) 200 MG tablet Take 200 mg by mouth daily.    [name] apixaban (ELIQUIS) 5 MG  TABS tablet Take 1 tablet (5 mg total) by mouth 2 (two) times daily. 60 tablet 5  . aspirin 81 MG chewable tablet Chew by mouth daily.    [name] atorvastatin (LIPITOR) 20 MG tablet Take 20 mg by mouth at bedtime.    . carvedilol (COREG) 6.25 MG tablet Take 3.125 mg by mouth 2 (two) times daily with a meal.     . Cholecalciferol (VITAMIN D3) 1000 units CAPS Vitamin D3 1,000 unit capsule  Take by oral route.    . doxycycline (VIBRA-TABS) 100 MG tablet Take 1 tablet (100 mg total) by mouth 2 (two) times daily for 7 days. 14 tablet 0  . gabapentin (NEURONTIN) 300 MG capsule Take 300 mg by mouth daily.    [name] linagliptin (TRADJENTA) 5 MG TABS tablet Take 1 tablet (5 mg total) by mouth daily. 30 tablet 0  . MULTIPLE VITAMIN PO Take by mouth.    . NONFORMULARY OR COMPOUNDED ITEM Home oxygen 3L per minute on nasal cannula 1 each 0  . OXYGEN Inhale 2 L into the lungs as needed.    . pantoprazole (PROTONIX) 20 MG tablet Take 40 mg by mouth daily.    . predniSONE (DELTASONE) 50 MG tablet Take 1 tablet (50 mg total) by mouth daily. 5 tablet 0  . sevelamer carbonate (RENVELA) 800 MG tablet Take 800 mg by mouth 3 (three) times daily with meals.    . torsemide (DEMADEX) 100 MG tablet Take 50 mg by mouth. Take 1/2 tab PO on nondialysis days     No current facility-administered medications for this visit.    No Known Allergies     Objective:  BP 119/63   Pulse 75   Temp 98.1 F (36.7 C) (Oral)   Wt 260 lb (117.9 kg)   BMI 34.30 kg/m  Gen:  Alert, not ill-appearing, no distress, appropriate for age, obese male HEENT: head normocephalic without obvious abnormality, conjunctiva and cornea clear, wearing glasses, trachea midline Pulm: Normal work of breathing, normal phonation, breath sounds are diffusely coarse, wheezes or rhonchi CV: Normal rate, regular rhythm, s1 and s2 distinct, no murmurs, clicks or rubs  Neuro: alert and oriented x 3, no tremor MSK: fistula right arm, extremities atraumatic,  ambulating with a cane, 1+ peripheral edema Skin: intact, no rashes on exposed skin, no jaundice, no cyanosis   No results found for this or any previous visit (from the past 72 hour(s)). Dg Chest 2 View  Addendum Date: 05/14/2017   ADDENDUM REPORT: 05/14/2017 15:49 ADDENDUM: Per ordering clinician, patient is status post RIGHT lower lobectomy for lung cancer. Small RIGHT pleural effusion, likely without subpulmonic component. Electronically Signed   By: Elon Alas M.D.   On: 05/14/2017 15:49   Result Date:  05/14/2017 CLINICAL DATA:  Cough, dizziness and weakness. EXAM: CHEST - 2 VIEW COMPARISON:  None. FINDINGS: Cardiac silhouette is moderately enlarged. Tortuous calcified aorta. Status post median sternotomy for CABG. Dual lead LEFT cardiac defibrillator tips projecting RIGHT atrium and RIGHT ventricle. Elevated RIGHT hemidiaphragm with blunting of the RIGHT costophrenic angle. Strandy densities RIGHT lung base. No pneumothorax. Soft tissue planes and included osseous structures are nonsuspicious. IMPRESSION: Small to moderate RIGHT pleural effusion with suspected subpulmonic component. RIGHT lung base atelectasis. Moderate cardiomegaly. Aortic Atherosclerosis (ICD10-I70.0). Electronically Signed: By: Elon Alas M.D. On: 05/14/2017 15:37      Assessment and Plan: 72 y.o. male with complex PMH of ESRD on dialysis, afib on Eliquis, hx of adenocarcinoma of the lung s/p RLL lobectomy, CHF, CAD s/p stent, ICD, DM 2, ACD  Acute lower respiratory infection  - vital signs look great and patient is feeling clinically improved - complete Prednisone and Doxycycline course - continue to monitor home SpO2 to goal >88% - start plain Mucinex - cont Albuterol 1-2 puffs every 4 hours prn  Patient education and anticipatory guidance given Patient agrees with treatment plan Follow-up as needed if symptoms worsen or fail to improve  Darlyne Russian PA-C

## 2017-05-20 ENCOUNTER — Other Ambulatory Visit: Payer: Self-pay | Admitting: Physician Assistant

## 2017-05-20 ENCOUNTER — Telehealth: Payer: Self-pay

## 2017-05-20 DIAGNOSIS — R05 Cough: Secondary | ICD-10-CM

## 2017-05-20 DIAGNOSIS — R059 Cough, unspecified: Secondary | ICD-10-CM

## 2017-05-20 MED ORDER — GUAIFENESIN-CODEINE 100-10 MG/5ML PO SOLN: 5.0000 mL | Freq: Every day | ORAL | 0 refills | Status: DC

## 2017-05-20 NOTE — Telephone Encounter (Signed)
Wife reports that husband still has a bad cough and it is worse at night.  She said that he finished with the prednisone but is still taking mucinex and have one more day of the antibiotic.  She is asking could you extend the antibiotic through the rest of this week. Please advise. -EH/RMA

## 2017-05-20 NOTE — Telephone Encounter (Signed)
I will write him for a night-time cough suppressant. I anticipate he will have a cough for at least 2 weeks. Prolonged antibiotics will not help the bronchospasm or suppress the cough. Also encourage him not to lay flat at night and avoid back sleeping, which can worsen cough

## 2017-05-21 ENCOUNTER — Ambulatory Visit (INDEPENDENT_AMBULATORY_CARE_PROVIDER_SITE_OTHER): Payer: Medicare Other | Admitting: Vascular Surgery

## 2017-05-21 ENCOUNTER — Ambulatory Visit: Payer: Medicare Other

## 2017-05-21 ENCOUNTER — Encounter: Payer: Self-pay | Admitting: Vascular Surgery

## 2017-05-21 VITALS — BP 121/59 | HR 80 | Temp 98.5°F | Resp 16 | Ht 74.0 in | Wt 259.0 lb

## 2017-05-21 DIAGNOSIS — I714 Abdominal aortic aneurysm, without rupture, unspecified: Secondary | ICD-10-CM

## 2017-05-21 DIAGNOSIS — Z992 Dependence on renal dialysis: Secondary | ICD-10-CM

## 2017-05-21 DIAGNOSIS — I723 Aneurysm of iliac artery: Secondary | ICD-10-CM | POA: Diagnosis not present

## 2017-05-21 DIAGNOSIS — N186 End stage renal disease: Secondary | ICD-10-CM | POA: Diagnosis not present

## 2017-05-21 NOTE — Progress Notes (Signed)
Requested by: Trixie Dredge, PA-C [place] [facility] [place], [place] [address]  Reason for consultation: AAA and bilateral iliac artery aneurysms   History of Present Illness   Justin Browning is a 72 y.o. ([birth year]) male who is referred to clinic for evaluation of AAA and bilateral iliac artery aneurysms.  Past medical history significant for end-stage renal disease on hemodialysis via right radiocephalic fistula, lung cancer s/p lobectomy, atrial fibrillation on Eliquis, CAD. CHF with ICD, and type 2 diabetes mellitus.  These aneurysms have been known to the patient over the past 5 years however recent lumbar spine CT noted aneurysms incidentally.  Distal AAA measures 3.7 cm by CT with bilateral common iliac artery aneurysms measuring 2.4 cm each by CT. he denies new or changing abdominal pain.  He also denies rest pain, active tissue ischemia, or discoloration of bilateral lower extremities.  Patient and his wife are agreeable to surveillance of aortoiliac aneurysms here in [place].  Patient periodically returns to [place] where he is from and states he will have a fistulogram for right radiocephalic fistula during his next visit.  He denies any difficulty completing hemodialysis treatments recently.  He is a former smoker.  Past Medical History:  Diagnosis Date  . Abdominal aortic aneurysm (AAA) without rupture ([place]) 01/31/2017  . Anemia of chronic disease 10/11/2016  . Aneurysm ([place])    multiple   . Atrial flutter ([place]) 09/23/2014  . CAD (coronary artery disease) 10/11/2016   Overview:  Non STEMI 1/06 Cardiac Catheterization 1/06-Total occlusion of the LAD,OM1,LCX,RPDA CABG 02/15/04(LIMA-LAD,SVG-OM1,SVG-OM2-OM3) Postoperative course complicated by recurrent ventricular and atrial arrhythmias.Acute abdomen requiring exploratory laparotomy, partial resection of the rectosigmoid colon, diverting colostomy s/p revision. AICD 2/06  . Chronic atrial fibrillation ([place])  10/11/2016  . Chronic congestive heart failure ([place]) 10/11/2016  . Diabetes mellitus without complication ([place])   . Dyspnea on exertion 05/13/2015  . ESRD (end stage renal disease) on dialysis ([place])   . ESRD on dialysis ([place]) 10/11/2016  . Hernia of abdominal cavity   . History of adenocarcinoma of lung 10/11/2016   s/p partial lobectomy right lung  . Hyperlipidemia   . Ischemic cardiomyopathy 10/09/2016  . Lumbar degenerative disc disease 10/11/2016  . Mixed hyperlipidemia 10/11/2016  . Sleep apnea   . Thrombocytopenia ([place]) 10/11/2016  . Type 2 diabetes mellitus with chronic kidney disease on chronic dialysis, without long-term current use of insulin ([place]) 10/11/2016  . VT (ventricular tachycardia) ([place]) 05/12/2010   Overview:  AICD 03/09/04 [employer]    Past Surgical History:  Procedure Laterality Date  . CARDIAC DEFIBRILLATOR PLACEMENT    . CARDIAC SURGERY     Quad Bypass  . COLON SURGERY    . CORONARY ARTERY BYPASS GRAFT    . CORONARY ARTERY BYPASS GRAFT    . [place]  . KNEE SURGERY Bilateral   . LUNG LOBECTOMY Right   . PARTIAL COLECTOMY    . REPLACEMENT TOTAL KNEE Left     Social History   Socioeconomic History  . Marital status: Married    Spouse name: Not on file  . Number of children: Not on file  . Years of education: Not on file  . Highest education level: Not on file  Occupational History  . Not on file  Social Needs  . Financial resource strain: Not on file  . Food insecurity:    Worry: Not on file    Inability: Not on file  .  Transportation needs:    Medical: Not on file    Non-medical: Not on file  Tobacco Use  . Smoking status: Former Smoker    Packs/day: 3.00    Years: 40.00    Pack years: 120.00    Types: Cigarettes    Last attempt to quit: 08/20/2004    Years since quitting: 12.7  . Smokeless tobacco: Never Used  Substance and Sexual Activity  . Alcohol use: No  . Drug use: No  . Sexual activity: Not Currently  Lifestyle  .  Physical activity:    Days per week: Not on file    Minutes per session: Not on file  . Stress: Not on file  Relationships  . Social connections:    Talks on phone: Not on file    Gets together: Not on file    Attends religious service: Not on file    Active member of club or organization: Not on file    Attends meetings of clubs or organizations: Not on file    Relationship status: Not on file  . Intimate partner violence:    Fear of current or ex partner: Not on file    Emotionally abused: Not on file    Physically abused: Not on file    Forced sexual activity: Not on file  Other Topics Concern  . Not on file  Social History Narrative  . Not on file    Family History  Problem Relation Age of Onset  . Dementia Mother   . Diabetes Mother   . Stroke Mother   . Cancer Father        colon    Current Outpatient Medications  Medication Sig Dispense Refill  . albuterol (PROVENTIL HFA;VENTOLIN HFA) 108 (90 Base) MCG/ACT inhaler Inhale 1-2 puffs into the lungs every 4 (four) hours as needed for wheezing or shortness of breath (bronchospasm). 1 Inhaler 0  . amiodarone (PACERONE) 200 MG tablet Take 200 mg by mouth daily.    [name] apixaban (ELIQUIS) 5 MG TABS tablet Take 1 tablet (5 mg total) by mouth 2 (two) times daily. 60 tablet 5  . aspirin 81 MG chewable tablet Chew by mouth daily.    [name] atorvastatin (LIPITOR) 20 MG tablet Take 20 mg by mouth at bedtime.    . carvedilol (COREG) 6.25 MG tablet Take 3.125 mg by mouth 2 (two) times daily with a meal.     . Cholecalciferol (VITAMIN D3) 1000 units CAPS Vitamin D3 1,000 unit capsule  Take by oral route.    . doxycycline (VIBRA-TABS) 100 MG tablet Take 1 tablet (100 mg total) by mouth 2 (two) times daily for 7 days. 14 tablet 0  . gabapentin (NEURONTIN) 300 MG capsule Take 300 mg by mouth daily.    [name] guaiFENesin-codeine 100-10 MG/5ML syrup Take 5 mLs by mouth at bedtime for 7 days. 35 mL 0  . linagliptin (TRADJENTA) 5 MG TABS tablet Take 1  tablet (5 mg total) by mouth daily. 30 tablet 0  . MULTIPLE VITAMIN PO Take by mouth.    . NONFORMULARY OR COMPOUNDED ITEM Home oxygen 3L per minute on nasal cannula 1 each 0  . OXYGEN Inhale 2 L into the lungs as needed.    . pantoprazole (PROTONIX) 20 MG tablet Take 40 mg by mouth daily.    . predniSONE (DELTASONE) 50 MG tablet Take 1 tablet (50 mg total) by mouth daily. 5 tablet 0  . sevelamer carbonate (RENVELA) 800 MG tablet Take 800 mg by  mouth 3 (three) times daily with meals.    . torsemide (DEMADEX) 100 MG tablet Take 50 mg by mouth. Take 1/2 tab PO on nondialysis days     No current facility-administered medications for this visit.      No Known Allergies  REVIEW OF SYSTEMS (negative unless checked):   Cardiac:  []  Chest pain or chest pressure? [x]  Shortness of breath upon activity? []  Shortness of breath when lying flat? [x]  Irregular heart rhythm?  Vascular:  []  Pain in calf, thigh, or hip brought on by walking? []  Pain in feet at night that wakes you up from your sleep? []  Blood clot in your veins? []  Leg swelling?  Pulmonary:  [x]  Oxygen at home? [x]  Productive cough? []  Wheezing?  Neurologic:  []  Sudden weakness in arms or legs? []  Sudden numbness in arms or legs? []  Sudden onset of difficult speaking or slurred speech? []  Temporary loss of vision in one eye? []  Problems with dizziness?  Gastrointestinal:  []  Blood in stool? []  Vomited blood?  Genitourinary:  []  Burning when urinating? []  Blood in urine?  Psychiatric:  []  Major depression  Hematologic:  []  Bleeding problems? []  Problems with blood clotting?  Dermatologic:  []  Rashes or ulcers?  Constitutional:  []  Fever or chills?  Ear/Nose/Throat:  []  Change in hearing? []  Nose bleeds? []  Sore throat?  Musculoskeletal:  []  Back pain? []  Joint pain? []  Muscle pain?   For VQI Use Only   PRE-ADM LIVING Home  AMB STATUS Ambulatory with Assistance  CAD Sx None  PRIOR CHF Moderate   STRESS TEST No   Physical Examination     Vitals:   05/21/17 1259  BP: (!) 121/59  Pulse: 80  Resp: 16  Temp: 98.5 F (36.9 C)  SpO2: 91%  Weight: 259 lb (117.5 kg)  Height: 6\' 2"  (1.88 m)   Body mass index is 33.25 kg/m.  General Alert, O x 3, Obese, NAD  Head [place]/AT,          Neck Supple, mid-line trachea,    Pulmonary Sym exp, good B air movt, CTA B  Cardiac RRR, Nl S1, S2,   Vascular Vessel Right Left  Radial Palpable Palpable  Brachial Palpable Palpable  Carotid Palpable, No Bruit Palpable, No Bruit  Aorta Not palpable N/A  Femoral not examined not examined  Popliteal Not palpable Not palpable  PT not examined not examined  DP Palpable Palpable    Gastro- intestinal soft, non-distended, non-tender to palpation, palpable thrill and audible bruit R radiocephalic fistula  Musculo- skeletal Extremities without ischemic changes  ,  Neurologic A&O  Psychiatric Judgement intact, Mood & affect appropriate for pt's clinical situation  Dermatologic See M/S exam for extremity exam, No rashes otherwise noted  Lymphatic  Palpable lymph nodes: None    Non-Invasive Vascular Imaging   IMPRESSION: 1. Diffuse lumbar spine spondylosis as described above. 2. At L4-5 there is a broad-based disc osteophyte complex. Severe bilateral facet arthropathy. Severe spinal stenosis. Severe right foraminal stenosis. Mild left foraminal stenosis. 3. At L5-S1 there is a broad-based 4. Disc bulge with a central disc protrusion. Moderate bilateral facet arthropathy. Moderate bilateral foraminal stenosis. 5. Infrarenal abdominal aortic aneurysm and bilateral common iliac artery aneurysms. Infrarenal abdominal aortic aneurysm measures 3.7 cm in greatest AP diameter. Abdominal Aortic Aneurysm (ICD10-I71.9). Recommend follow-up aortic ultrasound in 2 years. This recommendation follows ACR consensus guidelines: White Paper of the ACR Incidental Findings Committee II on Vascular Findings. J  Am Coll Radiol 2013;  70:141-030. 6. Bilateral renal atrophy.  4 mm nonobstructing right UVJ calculus. 7.  Aortic Atherosclerosis (ICD10-I70.0). 8.  Aortic aneurysm NOS (ICD10-I71.9   Medical Decision Making   Justin Browning is a 72 y.o. (09-Jun-1945) male who presents with: asymptomatic AAA and B CIA aneurysms   3.7cm AAA and 2.4cm B CIA aneurysms by CT  No indication for repair at this time  Check aortoiliac duplex in 1 year for surveillance  Follow up as scheduled in [place] for R arm fistulogram  Follow up with PCP regularly for management of chronic medical conditions   Dagoberto Ligas, PA-C [facility]: [phone]   05/21/2017, 1:22 PM    I have examined the patient, reviewed and agree with above.  Discussed significance of his asymptomatic abdominal aortic and iliac aneurysms.  These have been followed for sometimes although we do not know the exact prior dimensions.  Reviewed his recent ultrasound and CT scan.  Feel comfortable with continued yearly observation.  We will schedule this for ultrasound in 1 year.  Did review symptoms of leaking aneurysm with the patient and his wife they know report immediately to the emergency room should this occur  Curt Jews, MD 05/21/2017 2:12 PM

## 2017-05-21 NOTE — Telephone Encounter (Signed)
Wife notified -EH/RMA  

## 2017-05-25 ENCOUNTER — Other Ambulatory Visit: Payer: Self-pay | Admitting: Physician Assistant

## 2017-05-27 ENCOUNTER — Other Ambulatory Visit: Payer: Self-pay | Admitting: Physician Assistant

## 2017-05-27 DIAGNOSIS — R0989 Other specified symptoms and signs involving the circulatory and respiratory systems: Secondary | ICD-10-CM

## 2017-05-27 DIAGNOSIS — R05 Cough: Secondary | ICD-10-CM

## 2017-05-27 DIAGNOSIS — R058 Other specified cough: Secondary | ICD-10-CM

## 2017-05-28 ENCOUNTER — Other Ambulatory Visit: Payer: Self-pay | Admitting: Physician Assistant

## 2017-05-28 DIAGNOSIS — E1122 Type 2 diabetes mellitus with diabetic chronic kidney disease: Secondary | ICD-10-CM

## 2017-05-28 DIAGNOSIS — Z992 Dependence on renal dialysis: Principal | ICD-10-CM

## 2017-05-28 DIAGNOSIS — N186 End stage renal disease: Principal | ICD-10-CM

## 2017-06-11 ENCOUNTER — Other Ambulatory Visit: Payer: Self-pay

## 2017-06-11 DIAGNOSIS — E782 Mixed hyperlipidemia: Secondary | ICD-10-CM

## 2017-06-11 DIAGNOSIS — I25119 Atherosclerotic heart disease of native coronary artery with unspecified angina pectoris: Secondary | ICD-10-CM

## 2017-06-11 MED ORDER — ATORVASTATIN CALCIUM 20 MG PO TABS: 20.0000 mg | ORAL_TABLET | Freq: Every day | ORAL | 0 refills | Status: DC

## 2017-06-11 MED ORDER — PANTOPRAZOLE SODIUM 20 MG PO TBEC: 40.0000 mg | DELAYED_RELEASE_TABLET | Freq: Every day | ORAL | 1 refills | Status: DC

## 2017-06-18 ENCOUNTER — Encounter: Payer: Self-pay | Admitting: Podiatry

## 2017-06-18 ENCOUNTER — Ambulatory Visit (INDEPENDENT_AMBULATORY_CARE_PROVIDER_SITE_OTHER): Payer: Medicare Other | Admitting: Podiatry

## 2017-06-18 VITALS — BP 148/73 | HR 86 | Ht 74.0 in | Wt 259.0 lb

## 2017-06-18 DIAGNOSIS — M79671 Pain in right foot: Secondary | ICD-10-CM | POA: Diagnosis not present

## 2017-06-18 DIAGNOSIS — I25119 Atherosclerotic heart disease of native coronary artery with unspecified angina pectoris: Secondary | ICD-10-CM

## 2017-06-18 DIAGNOSIS — B351 Tinea unguium: Secondary | ICD-10-CM

## 2017-06-18 DIAGNOSIS — M79672 Pain in left foot: Secondary | ICD-10-CM

## 2017-06-18 NOTE — Patient Instructions (Signed)
Seen for hypertrophic nails. All nails debrided. Return in 3 months or as needed.  

## 2017-06-18 NOTE — Progress Notes (Signed)
SUBJECTIVE: 72 y.o. year old male presents requesting toe nails trimmed. Been diabetic 4-5 years. Blood sugar is under control and does not check regularly. Moved from [place] and has not had podiatry care for a while.  Review of Systems  Constitutional: Negative.   HENT: Negative.   Eyes: Negative.   Respiratory:       History of lung surgery 2 years ago.  Cardiovascular:       Has bad heart. 45% is working. History of heart attack 2006. Has device implanted.   Gastrointestinal: Negative.   Genitourinary: Negative.   Musculoskeletal:       Bilateral knee surgery, 2014, 2015.  Skin: Negative.     OBJECTIVE: DERMATOLOGIC EXAMINATION: Thick dystrophic nails x 10.  VASCULAR EXAMINATION OF LOWER LIMBS: All pedal pulses are palpable with normal pulsation.  Positive for foot and ankle edema bilateral. Temperature gradient from tibial crest to dorsum of foot is within normal bilateral.  NEUROLOGIC EXAMINATION OF THE LOWER LIMBS: All epicritic and tactile sensations grossly intact. Sharp and Dull discriminatory sensations at the plantar ball of hallux is intact bilateral.   MUSCULOSKELETAL EXAMINATION: No gross deformities.  ASSESSMENT: Onychomycosis. Diabetic under control. Pain in foot bilateral.  PLAN: Reviewed findings and available treatment options. All nails debrided. May return in 3 months.

## 2017-06-20 ENCOUNTER — Encounter: Payer: Self-pay | Admitting: Sports Medicine

## 2017-06-20 ENCOUNTER — Ambulatory Visit (INDEPENDENT_AMBULATORY_CARE_PROVIDER_SITE_OTHER): Payer: Medicare Other | Admitting: Sports Medicine

## 2017-06-20 DIAGNOSIS — M5136 Other intervertebral disc degeneration, lumbar region: Secondary | ICD-10-CM | POA: Diagnosis not present

## 2017-06-20 DIAGNOSIS — I25119 Atherosclerotic heart disease of native coronary artery with unspecified angina pectoris: Secondary | ICD-10-CM

## 2017-06-20 DIAGNOSIS — M51369 Other intervertebral disc degeneration, lumbar region without mention of lumbar back pain or lower extremity pain: Secondary | ICD-10-CM

## 2017-06-20 NOTE — Progress Notes (Signed)
Subjective:    CC: Follow-up  HPI: Lumbar spinal stenosis: Good improvement with manipulation with Dr. Sheppard Coil, not happy with aquatic therapy, he does desire to do some more stationary biking.  I reviewed the past medical history, family history, social history, surgical history, and allergies today and no changes were needed.  Please see the problem list section below in epic for further details.  Past Medical History: Past Medical History:  Diagnosis Date  . Abdominal aortic aneurysm (AAA) without rupture ([place]) 01/31/2017  . Anemia of chronic disease 10/11/2016  . Aneurysm ([place])    multiple   . Atrial flutter ([place]) 09/23/2014  . CAD (coronary artery disease) 10/11/2016   Overview:  Non STEMI 1/06 Cardiac Catheterization 1/06-Total occlusion of the LAD,OM1,LCX,RPDA CABG 02/15/04(LIMA-LAD,SVG-OM1,SVG-OM2-OM3) Postoperative course complicated by recurrent ventricular and atrial arrhythmias.Acute abdomen requiring exploratory laparotomy, partial resection of the rectosigmoid colon, diverting colostomy s/p revision. AICD 2/06  . Chronic atrial fibrillation ([place]) 10/11/2016  . Chronic congestive heart failure ([place]) 10/11/2016  . Diabetes mellitus without complication ([place])   . Dyspnea on exertion 05/13/2015  . ESRD (end stage renal disease) on dialysis ([place])   . ESRD on dialysis ([place]) 10/11/2016  . Hernia of abdominal cavity   . History of adenocarcinoma of lung 10/11/2016   s/p partial lobectomy right lung  . Hyperlipidemia   . Ischemic cardiomyopathy 10/09/2016  . Lumbar degenerative disc disease 10/11/2016  . Mixed hyperlipidemia 10/11/2016  . Sleep apnea   . Thrombocytopenia ([place]) 10/11/2016  . Type 2 diabetes mellitus with chronic kidney disease on chronic dialysis, without long-term current use of insulin ([place]) 10/11/2016  . VT (ventricular tachycardia) ([place]) 05/12/2010   Overview:  AICD 03/09/04 [employer]   Past Surgical History: Past Surgical History:  Procedure Laterality Date   . CARDIAC DEFIBRILLATOR PLACEMENT    . CARDIAC SURGERY     Quad Bypass  . COLON SURGERY    . CORONARY ARTERY BYPASS GRAFT    . CORONARY ARTERY BYPASS GRAFT    . [place]  . KNEE SURGERY Bilateral   . LUNG LOBECTOMY Right   . PARTIAL COLECTOMY    . REPLACEMENT TOTAL KNEE Left    Social History: Social History   Socioeconomic History  . Marital status: Married    Spouse name: Not on file  . Number of children: Not on file  . Years of education: Not on file  . Highest education level: Not on file  Occupational History  . Not on file  Social Needs  . Financial resource strain: Not on file  . Food insecurity:    Worry: Not on file    Inability: Not on file  . Transportation needs:    Medical: Not on file    Non-medical: Not on file  Tobacco Use  . Smoking status: Former Smoker    Packs/day: 3.00    Years: 40.00    Pack years: 120.00    Types: Cigarettes    Last attempt to quit: 08/20/2004    Years since quitting: 12.8  . Smokeless tobacco: Never Used  Substance and Sexual Activity  . Alcohol use: No  . Drug use: No  . Sexual activity: Not Currently  Lifestyle  . Physical activity:    Days per week: Not on file    Minutes per session: Not on file  . Stress: Not on file  Relationships  . Social connections:    Talks on phone: Not on file    Gets together: Not  on file    Attends religious service: Not on file    Active member of club or organization: Not on file    Attends meetings of clubs or organizations: Not on file    Relationship status: Not on file  Other Topics Concern  . Not on file  Social History Narrative  . Not on file   Family History: Family History  Problem Relation Age of Onset  . Dementia Mother   . Diabetes Mother   . Stroke Mother   . Cancer Father        colon   Allergies: No Known Allergies Medications: See med rec.  Review of Systems: No fevers, chills, night sweats, weight loss, chest pain, or shortness of breath.    Objective:    General: Well Developed, well nourished, and in no acute distress.  Neuro: Alert and oriented x3, extra-ocular muscles intact, sensation grossly intact.  HEENT: Normocephalic, atraumatic, pupils equal round reactive to light, neck supple, no masses, no lymphadenopathy, thyroid nonpalpable.  Skin: Warm and dry, no rashes. Cardiac: Regular rate and rhythm, no murmurs rubs or gallops, no lower extremity edema.  Respiratory: Clear to auscultation bilaterally. Not using accessory muscles, speaking in full sentences.  Impression and Recommendations:    Lumbar degenerative disc disease Profound profound spinal stenosis at L4-L5, this is the cause of his intermittent incontinence, weakness in the legs. Declines any form of invasive intervention. He did note several days of relief with osteopathic manipulation, I would like him to do this regularly with Dr. Sheppard Coil. Declines any more aquatic therapy, he does enjoy the stationary bike which provides him some more strength and endurance, I would like him to have regular sessions with physical therapy for supervised use of their stationary bike, he is not safe to do it by himself at a gym. Return to see me as needed.  I spent 25 minutes with this patient, greater than 50% was face-to-face time counseling regarding the above diagnoses ___________________________________________ Gwen Her. Dianah Field, M.D., ABFM., CAQSM. Primary Care and [place] Instructor of [place] [facility] of Medicine

## 2017-06-20 NOTE — Assessment & Plan Note (Signed)
Profound profound spinal stenosis at L4-L5, this is the cause of his intermittent incontinence, weakness in the legs. Declines any form of invasive intervention. He did note several days of relief with osteopathic manipulation, I would like him to do this regularly with Dr. Sheppard Coil. Declines any more aquatic therapy, he does enjoy the stationary bike which provides him some more strength and endurance, I would like him to have regular sessions with physical therapy for supervised use of their stationary bike, he is not safe to do it by himself at a gym. Return to see me as needed.

## 2017-06-24 ENCOUNTER — Telehealth: Payer: Self-pay

## 2017-06-24 NOTE — Telephone Encounter (Signed)
Called and advised pt's wife they need to contact cardiology for this refill.  Wife agreeable and states she will contact Dr Mauricio Po.  No further needs at this time.

## 2017-06-24 NOTE — Telephone Encounter (Signed)
Pt's wife called request a refill on pt's Amiodarone be sent to [facility] in [place] for a 90 day supply.  In med list it is noted this was last written by historical provider.   Pt takes 200mg  tablets, 1 daily.   Per chart, pt does see Dr Mauricio Po for cardiology.   Evlyn Clines, will you refill this or does pt need to contact cardiology for this?   Please advise. Thanks!

## 2017-06-24 NOTE — Telephone Encounter (Signed)
Prefer patient contact Cardiology for this refill

## 2017-06-25 ENCOUNTER — Ambulatory Visit: Payer: Medicare Other | Admitting: Osteopathic Medicine

## 2017-06-27 ENCOUNTER — Encounter: Payer: Self-pay | Admitting: Osteopathic Medicine

## 2017-06-27 ENCOUNTER — Ambulatory Visit (INDEPENDENT_AMBULATORY_CARE_PROVIDER_SITE_OTHER): Payer: Medicare Other | Admitting: Osteopathic Medicine

## 2017-06-27 VITALS — BP 117/62 | HR 79 | Temp 98.2°F | Wt 250.8 lb

## 2017-06-27 DIAGNOSIS — M5136 Other intervertebral disc degeneration, lumbar region: Secondary | ICD-10-CM

## 2017-06-27 DIAGNOSIS — M9903 Segmental and somatic dysfunction of lumbar region: Secondary | ICD-10-CM

## 2017-06-27 DIAGNOSIS — Z23 Encounter for immunization: Secondary | ICD-10-CM | POA: Diagnosis not present

## 2017-06-27 NOTE — Progress Notes (Signed)
HPI: Justin Browning is a 72 y.o. male who  has a past medical history of Abdominal aortic aneurysm (AAA) without rupture ([place]) (01/31/2017), Anemia of chronic disease (10/11/2016), Aneurysm ([place]), Atrial flutter ([place]) (09/23/2014), CAD (coronary artery disease) (10/11/2016), Chronic atrial fibrillation ([place]) (10/11/2016), Chronic congestive heart failure ([place]) (10/11/2016), Diabetes mellitus without complication ([place]), Dyspnea on exertion (05/13/2015), ESRD (end stage renal disease) on dialysis [facility]), ESRD on dialysis ([place]) (10/11/2016), Hernia of abdominal cavity, History of adenocarcinoma of lung (10/11/2016), Hyperlipidemia, Ischemic cardiomyopathy (10/09/2016), Lumbar degenerative disc disease (10/11/2016), Mixed hyperlipidemia (10/11/2016), Sleep apnea, Thrombocytopenia ([place]) (10/11/2016), Type 2 diabetes mellitus with chronic kidney disease on chronic dialysis, without long-term current use of insulin ([place]) (10/11/2016), and VT (ventricular tachycardia) ([place]) (05/12/2010).  he presents to [facility] today, 06/27/17,  for chief complaint of: OMT for back pain   Hx central canal stenosis Lumbar spine, opted for no invasive treatment, aquatic PT -he states that this is working a little bit when he was able to participate in it regularly, he is recently joined [employer] and is looking forward to continuing some water aerobics on his own.. Continues to have axial back pain with bilateral radiculopathy -late up a bit after a fall while he was in [place] visiting family.  Manipulation treatments he has had before doesn't sound like any kind of formal osteopathic physician or chiropractor, but states that he knew a guy who would stretch the muscles in the back.  He states that some of the stretching exercises we did last time were helpful for him and would like to repeat today.      Past medical, surgical, social and family history reviewed.    Current medication list and  allergy/intolerance information reviewed.      Review of Systems:  Constitutional:  No  fever, no chills, No recent illness,  HEENT: No  headache, no vision change  Cardiac: No  chest pain, No  pressure, No palpitations  Respiratory:  No  shortness of breath. No  Cough  Gastrointestinal: No  abdominal pain  Musculoskeletal: No new myalgia/arthralgia -see HPI  Skin: No  Rash  Neurologic: No new weakness, No  dizziness   Exam:  BP 117/62 (BP Location: Left Arm, Patient Position: Sitting, Cuff Size: Normal)   Pulse 79   Temp 98.2 F (36.8 C) (Oral)   Wt 250 lb 12.8 oz (113.8 kg)   BMI 32.20 kg/m   Constitutional: VS see above. General Appearance: alert, well-developed, well-nourished, NAD  Eyes: Normal lids and conjunctive, non-icteric sclera  Ears, Nose, Mouth, Throat: MMM, Normal external inspection ears/nares/mouth/lips/gums.  Neck: No masses, trachea midline.   Respiratory: Normal respiratory effort.  Musculoskeletal:   Relatively similar from previous exam: Very poor posture, increased thoracic kyphosis, decreased lumbar lordosis. Stable from exam 03/21/17. Atrophied musculature in gluteus area, quads, hamstrings. Limited core muscle strength, looks like some significant scar tissue previous surgeries.  (+)TART changes diffusely worse in the lumbar spine with limited rotation, flexion, extension. Positive muscle spasm and quadratus lumborum area both sides.  Similar to previous exam 03/21/2017  Neurological: Normal balance/coordination. No tremor.   Skin: warm, dry, intact. No rash/ulcer.   Psychiatric: Normal judgment/insight. Normal mood and affect. Oriented x3.      ASSESSMENT/PLAN:   Somatic dysfunction of spine, lumbar - OMT applied: Myofascial release, FPR to patient relief.  Would like to set up routine appointments for OMM  Lumbar degenerative disc disease - Would avoid HVLA or other particularly active  treatments  Need for Tdap vaccination -  Plan: Tdap vaccine greater than or equal to 7yo IM  Need for pneumococcal vaccine - Plan: Pneumococcal polysaccharide vaccine 23-valent greater than or equal to 2yo subcutaneous/IM      Visit summary with medication list and pertinent instructions was printed for patient to review. All questions at time of visit were answered - patient instructed to contact office with any additional concerns. ER/RTC precautions were reviewed with the patient.   Follow-up plan: Return if symptoms worsen or fail to improve, routine OMM treatments as desired.  Note: Total time spent 30 minutes, greater than 50% of the visit was spent face-to-face counseling and coordinating care for the following: The primary encounter diagnosis was Need for Tdap vaccination. A diagnosis of Need for pneumococcal vaccine was also pertinent to this visit.[name]  Please note: voice recognition software was used to produce this document, and typos may escape review. Please contact Dr. Sheppard Coil for any needed clarifications.

## 2017-06-28 ENCOUNTER — Encounter: Payer: Self-pay | Admitting: Osteopathic Medicine

## 2017-06-30 ENCOUNTER — Other Ambulatory Visit: Payer: Self-pay | Admitting: Physician Assistant

## 2017-06-30 DIAGNOSIS — N186 End stage renal disease: Principal | ICD-10-CM

## 2017-06-30 DIAGNOSIS — Z992 Dependence on renal dialysis: Principal | ICD-10-CM

## 2017-06-30 DIAGNOSIS — E1122 Type 2 diabetes mellitus with diabetic chronic kidney disease: Secondary | ICD-10-CM

## 2017-07-02 ENCOUNTER — Encounter: Payer: Self-pay | Admitting: Physical Therapy

## 2017-07-02 ENCOUNTER — Ambulatory Visit (INDEPENDENT_AMBULATORY_CARE_PROVIDER_SITE_OTHER): Payer: Medicare Other | Admitting: Physical Therapy

## 2017-07-02 DIAGNOSIS — M6281 Muscle weakness (generalized): Secondary | ICD-10-CM

## 2017-07-02 DIAGNOSIS — G8929 Other chronic pain: Secondary | ICD-10-CM | POA: Diagnosis not present

## 2017-07-02 DIAGNOSIS — R2689 Other abnormalities of gait and mobility: Secondary | ICD-10-CM

## 2017-07-02 DIAGNOSIS — M545 Low back pain: Secondary | ICD-10-CM | POA: Diagnosis not present

## 2017-07-02 NOTE — Therapy (Signed)
[place], [place], [address] Phone: [phone]   Fax:  [phone]  Physical Therapy Evaluation  Patient Details  Name: Justin Browning MRN: [id number] Date of Birth: 11-19-1945 Referring Provider: Dr Emeterio Reeve   Encounter Date: 07/02/2017  PT End of Session - 07/02/17 1149    Visit Number  1    Number of Visits  6    Date for PT Re-Evaluation  08/13/17    PT Start Time  5852    PT Stop Time  1244    PT Time Calculation (min)  55 min    Activity Tolerance  Patient tolerated treatment well       Past Medical History:  Diagnosis Date  . Abdominal aortic aneurysm (AAA) without rupture ([place]) 01/31/2017  . Anemia of chronic disease 10/11/2016  . Aneurysm ([place])    multiple   . Atrial flutter ([place]) 09/23/2014  . CAD (coronary artery disease) 10/11/2016   Overview:  Non STEMI 1/06 Cardiac Catheterization 1/06-Total occlusion of the LAD,OM1,LCX,RPDA CABG 02/15/04(LIMA-LAD,SVG-OM1,SVG-OM2-OM3) Postoperative course complicated by recurrent ventricular and atrial arrhythmias.Acute abdomen requiring exploratory laparotomy, partial resection of the rectosigmoid colon, diverting colostomy s/p revision. AICD 2/06  . Chronic atrial fibrillation ([place]) 10/11/2016  . Chronic congestive heart failure ([place]) 10/11/2016  . Diabetes mellitus without complication ([place])   . Dyspnea on exertion 05/13/2015  . ESRD (end stage renal disease) on dialysis ([place])   . ESRD on dialysis ([place]) 10/11/2016  . Hernia of abdominal cavity   . History of adenocarcinoma of lung 10/11/2016   s/p partial lobectomy right lung  . Hyperlipidemia   . Ischemic cardiomyopathy 10/09/2016  . Lumbar degenerative disc disease 10/11/2016  . Mixed hyperlipidemia 10/11/2016  . Sleep apnea   . Thrombocytopenia ([place]) 10/11/2016  . Type 2 diabetes mellitus with chronic kidney disease on chronic dialysis, without long-term current use of insulin ([place])  10/11/2016  . VT (ventricular tachycardia) ([place]) 05/12/2010   Overview:  AICD 03/09/04 [employer]    Past Surgical History:  Procedure Laterality Date  . CARDIAC DEFIBRILLATOR PLACEMENT    . CARDIAC SURGERY     Quad Bypass  . COLON SURGERY    . CORONARY ARTERY BYPASS GRAFT    . CORONARY ARTERY BYPASS GRAFT    . [place]  . KNEE SURGERY Bilateral   . LUNG LOBECTOMY Right   . PARTIAL COLECTOMY    . REPLACEMENT TOTAL KNEE Left     There were no vitals filed for this visit.   Subjective Assessment - 07/02/17 1150    Subjective  Pt reports a h/o low back and knee problems for most of his life. As he has gotten older it has troubled him more. He has an increase in back pain for the last 3 years since he left  [place].  He said he has everything and anything wrong with him.  The doctor he saw in [place] would adjust him and make him feel better.He also used to exercise in the water, [place]     Pertinent History  dialysis M/W/F, h/o lung CA 2017 with lobectomy, bilat TKA, Rt hip ORIF in the 60's, defibrillator for heart - not a pacemaker per pt.     Diagnostic tests  x-ray, old MRI    Patient Stated Goals  walk better - have his knees and back feel better    Currently in Pain?  Yes    Pain Score  3     Pain Location  Back    Pain Orientation  Lower;Left    Pain Type  Chronic pain    Pain Onset  More than a month ago    Pain Frequency  Constant    Aggravating Factors   lifting something    Pain Relieving Factors  "vodoo doctor" when he was in [place] PT Assessment - 07/02/17 0001      Assessment   Medical Diagnosis  Lumbar DDD    Referring Provider  Dr Emeterio Reeve    Onset Date/Surgical Date  07/03/14    Next MD Visit  PRN    Prior Therapy  not for back      Precautions   Precautions  None as relates to his back, has other precautions due to other d      Balance Screen   Has the patient fallen  in the past 6 months  Yes    How many times?  3 with in 2 days of each other - had abdominal issues - [place]      [place] residence    Living Arrangements  Spouse/significant other    [place]  Two level has some difficulty - uses bilat rails      Prior Function   Level of [place]  Retired    Leisure  sedentary life style      Cognition   Overall Cognitive Status  Impaired/Different from baseline pt asked same questions multiple times      Observation/Other Assessments   Focus on Therapeutic Outcomes (FOTO)   56% limited      Posture/Postural Control   Posture/Postural Control  Postural limitations    Postural Limitations  Forward head;Rounded Shoulders;Flexed trunk extra abdominal girth, wid eBOS      ROM / Strength   AROM / PROM / Strength  AROM;Strength      AROM   AROM Assessment Site  Hip;Lumbar    Lumbar Flexion  to mid shin with UE support    Lumbar Extension  10% present    Lumbar - Right Rotation  25% present    Lumbar - Left Rotation  25% present      Strength   Strength Assessment Site  Hip;Knee;Lumbar;Ankle    Right/Left Hip  Left;Right    Right Hip Flexion  5/5    Right Hip Extension  4/5    Right Hip ABduction  5/5    Left Hip Flexion  5/5    Left Hip Extension  4-/5    Left Hip ABduction  4/5    Right/Left Knee  -- WNL with break test    Right/Left Ankle  -- bilat DF grossly 4/5    Lumbar Flexion  -- poor    Lumbar Extension  -- poor      Palpation   Spinal mobility  NA not enough time in his sesion    Palpation comment  NA not enough time in his session      Transfers   Transfers  Sit to Stand    Sit to Stand  6: Modified independent (Device/Increase time);With upper extremity assist      Ambulation/Gait   Ambulation/Gait  Yes    Ambulation/Gait Assistance  6: Modified independent (Device/Increase time)    Ambulation Distance (Feet)  -- observed in clinic  Assistive  device  Straight cane    Gait Pattern  Shuffle;Trunk flexed;Wide base of support;Lateral hip instability bilat LE ER , uses knee hyperextension for stability in stan                Objective measurements completed on examination: See above findings.      [place] Adult PT Treatment/Exercise - 07/02/17 0001      Exercises   Exercises  Lumbar      Lumbar Exercises: Stretches   Hip Flexor Stretch  Left;Right;20 seconds seated with VC for form      Lumbar Exercises: Standing   Functional Squats  5 reps sit to/from stand    Scapular Retraction  Strengthening;Both;15 reps;Theraband VC for form    Theraband Level (Scapular Retraction)  Level 2 (Red)      Lumbar Exercises: Supine   Bridge  5 reps to easy per patient    Single Leg Bridge  15 reps each side, VC for form             PT Education - 07/02/17 1308    Education Details  HEP    Person(s) Educated  Patient    Methods  Explanation;Demonstration;Handout;Verbal cues    Comprehension  Returned demonstration;Verbalized understanding;Need further instruction          PT Long Term Goals - 07/02/17 1314      PT LONG TERM GOAL #1   Title  I with advanced HEP for strengthening and balance ( 08/13/17)     Time  6    Period  Weeks    Status  New      PT LONG TERM GOAL #2   Title  increase strength bilat LE's =/> 5-/5 ( 08/13/17)     Time  6    Period  Weeks    Status  New      PT LONG TERM GOAL #3   Title  report =/> 50% reduction of pain ( 08/13/17)     Time  6    Period  Weeks    Status  New      PT LONG TERM GOAL #4   Title  improve FOTO =/< 45% limited ( 08/13/17)     Time  6    Period  Weeks    Status  New      PT LONG TERM GOAL #5   Title  add balance goal              Plan - 07/02/17 1309    Clinical Impression Statement  72 yo male with complicated medical h/o presents with referral for low back pain.  He is also very concerned about his LE strength.  Pt does have some LE weakness along  with core weakness.  He is unstable in standing with ataxic type movements and difficulty maintaining stability.  He uses a cane when out in the community, nothing in the house. He is lmited in attendance due to dialysis M-W-F and other MD appointments.  He has had treatment for his back in the past in [place] and had good results from what sounded like adjustments. He is going to be getting these starting Thursday with referring MD.   He had difficulty following some directions and repeated his questions during the evaluation.  Would benefit from a BERG or DGI to truely assess balance.       History and Personal Factors relevant to plan of care:  refer to snap shot for full history  Clinical Presentation  Evolving    Clinical Decision Making  Moderate    Rehab Potential  Good    PT Frequency  1x / week    PT Duration  6 weeks    PT Treatment/Interventions  Gait training;Neuromuscular re-education;Manual techniques;Functional mobility training;Moist Heat;Ultrasound;Therapeutic activities;Patient/family education;Therapeutic exercise;Cryotherapy    PT Next Visit Plan  possibly perform balance assessment, lumbar stabilization in standing and [place] st[place],     Consulted and Agree with Plan of Care  Patient       Patient will benefit from skilled therapeutic intervention in order to improve the following deficits and impairments:  Abnormal gait, Pain, Improper body mechanics, Postural dysfunction, Decreased coordination, Decreased activity tolerance, Decreased range of motion, Decreased strength, Obesity, Difficulty walking, Decreased safety awareness, Decreased balance  Visit Diagnosis: Chronic bilateral low back pain without sciatica - Plan: PT plan of care cert/re-cert  Muscle weakness (generalized) - Plan: PT plan of care cert/re-cert  Other abnormalities of gait and mobility - Plan: PT plan of care cert/re-cert     Problem List Patient Active Problem List   Diagnosis Date Noted  .  Pleural effusion on right 05/16/2017  . Restrictive lung disease 05/14/2017  . History of pneumonia 03/29/2017  . Cough 03/29/2017  . Iliac artery aneurysm, bilateral ([place]) 02/24/2017  . Osteoarthritis of multiple joints 02/08/2017  . Requires continuous at home supplemental oxygen 02/08/2017  . Encounter for long-term (current) use of high-risk medication 01/31/2017  . Abdominal aortic aneurysm (AAA) without rupture ([place]) 01/31/2017  . History of adenocarcinoma of lung 10/11/2016  . ESRD on dialysis ([place]) 10/11/2016  . Paroxysmal A-fib ([place]) 10/11/2016  . Type 2 diabetes mellitus with chronic kidney disease on chronic dialysis, without long-term current use of insulin ([place]) 10/11/2016  . Mixed hyperlipidemia 10/11/2016  . Artificial cardiac pacemaker 10/11/2016  . Coronary artery disease involving native coronary artery of native heart without angina pectoris 10/11/2016  . Anemia of chronic disease 10/11/2016  . Thrombocytopenia ([place]) 10/11/2016  . AICD (automatic cardioverter/defibrillator) present 10/11/2016  . Chronic congestive heart failure ([place]) 10/11/2016  . Hx of CABG 10/11/2016  . Non-gaseous abdominal distention 10/11/2016  . Enlarged prostate 10/11/2016  . Lumbar degenerative disc disease 10/11/2016  . Ischemic cardiomyopathy 10/09/2016  . Chest pain 06/19/2016  . Dyspnea on exertion 05/13/2015  . Long term current use of anticoagulants with INR goal of 2.0-3.0 05/13/2015  . CKD (chronic kidney disease) stage 5, GFR less than 15 ml/min (HCC) 01/13/2014    Manuela Schwartz Abrina Petz PT  07/02/2017, 1:26 PM  [facility] [place], [place], [address] Phone: [phone]   Fax:  [phone]  Name: Justin Browning MRN: [id number] Date of Birth: Feb 21, 1945

## 2017-07-04 ENCOUNTER — Encounter: Payer: Self-pay | Admitting: Osteopathic Medicine

## 2017-07-04 ENCOUNTER — Ambulatory Visit (INDEPENDENT_AMBULATORY_CARE_PROVIDER_SITE_OTHER): Payer: Medicare Other | Admitting: Osteopathic Medicine

## 2017-07-04 VITALS — BP 113/64 | HR 77 | Temp 98.3°F | Wt 254.0 lb

## 2017-07-04 DIAGNOSIS — G8929 Other chronic pain: Secondary | ICD-10-CM

## 2017-07-04 DIAGNOSIS — M545 Low back pain: Secondary | ICD-10-CM

## 2017-07-04 DIAGNOSIS — M9903 Segmental and somatic dysfunction of lumbar region: Secondary | ICD-10-CM

## 2017-07-04 DIAGNOSIS — M4004 Postural kyphosis, thoracic region: Secondary | ICD-10-CM

## 2017-07-04 DIAGNOSIS — M4005 Postural kyphosis, thoracolumbar region: Secondary | ICD-10-CM | POA: Diagnosis not present

## 2017-07-04 DIAGNOSIS — I25119 Atherosclerotic heart disease of native coronary artery with unspecified angina pectoris: Secondary | ICD-10-CM | POA: Diagnosis not present

## 2017-07-04 NOTE — Progress Notes (Signed)
HPI: Justin Browning is a 72 y.o. male who  has a past medical history of Abdominal aortic aneurysm (AAA) without rupture ([place]) (01/31/2017), Anemia of chronic disease (10/11/2016), Aneurysm ([place]), Atrial flutter ([place]) (09/23/2014), CAD (coronary artery disease) (10/11/2016), Chronic atrial fibrillation ([place]) (10/11/2016), Chronic congestive heart failure ([place]) (10/11/2016), Diabetes mellitus without complication ([place]), Dyspnea on exertion (05/13/2015), ESRD (end stage renal disease) on dialysis [facility]), ESRD on dialysis ([place]) (10/11/2016), Hernia of abdominal cavity, History of adenocarcinoma of lung (10/11/2016), Hyperlipidemia, Ischemic cardiomyopathy (10/09/2016), Lumbar degenerative disc disease (10/11/2016), Mixed hyperlipidemia (10/11/2016), Sleep apnea, Thrombocytopenia ([place]) (10/11/2016), Type 2 diabetes mellitus with chronic kidney disease on chronic dialysis, without long-term current use of insulin ([place]) (10/11/2016), and VT (ventricular tachycardia) ([place]) (05/12/2010).  he presents to [facility] today, 07/04/17,  for chief complaint of:  Back pain - OMT   Been following with me for myofascial release and FPR treatments of somatic dysfunctino of lumbar spine, same lower back pain today. He is thinking of purchasing a device to help with posture, we discussed this a bit as below   Past medical history, surgical history, and family history reviewed.  Current medication list and allergy/intolerance information reviewed.   (See remainder of HPI, ROS, Phys Exam below)    ASSESSMENT/PLAN: The primary encounter diagnosis was Chronic bilateral low back pain without sciatica. Diagnoses of Somatic dysfunction of spine, lumbar, Postural kyphosis of lumbar region, and Postural kyphosis, thoracic region were also pertinent to this visit.  OMT applied to lumbar and thoracic region: FPR, MFR to +) patient relief   Dicsussed postural changes when sitting: lumbar support. Also  strengthening core musculature.      Follow-up plan: Return for repeat manipulations prn.     ############################################ ############################################ ############################################ ############################################    Outpatient Encounter Medications as of 07/04/2017  Medication Sig  . albuterol (PROVENTIL HFA;VENTOLIN HFA) 108 (90 Base) MCG/ACT inhaler Inhale 1-2 puffs into the lungs every 4 (four) hours as needed for wheezing or shortness of breath (bronchospasm).  [name] amiodarone (PACERONE) 200 MG tablet Take 200 mg by mouth daily.  [name] apixaban (ELIQUIS) 5 MG TABS tablet Take 1 tablet (5 mg total) by mouth 2 (two) times daily.  [name] aspirin 81 MG chewable tablet Chew by mouth daily.  [name] atorvastatin (LIPITOR) 20 MG tablet Take 1 tablet (20 mg total) by mouth at bedtime.  . carvedilol (COREG) 6.25 MG tablet Take 3.125 mg by mouth 2 (two) times daily with a meal.   . Cholecalciferol (VITAMIN D3) 1000 units CAPS Vitamin D3 1,000 unit capsule  Take by oral route.  . gabapentin (NEURONTIN) 300 MG capsule Take 300 mg by mouth daily.  . MULTIPLE VITAMIN PO Take by mouth.  . NONFORMULARY OR COMPOUNDED ITEM Home oxygen 3L per minute on nasal cannula  . OXYGEN Inhale 2 L into the lungs as needed.  . pantoprazole (PROTONIX) 20 MG tablet Take 2 tablets (40 mg total) by mouth daily.  . sevelamer carbonate (RENVELA) 800 MG tablet Take 800 mg by mouth 3 (three) times daily with meals.  . torsemide (DEMADEX) 100 MG tablet Take 50 mg by mouth. Take 1/2 tab PO on nondialysis days  . TRADJENTA 5 MG TABS tablet TAKE 1 TABLET(5 MG) BY MOUTH DAILY   No facility-administered encounter medications on file as of 07/04/2017.    No Known Allergies    Review of Systems:  Constitutional: No recent illness  HEENT: No  headache, no vision change  Cardiac: No  chest  pain  Respiratory:  No  shortness of breath. Musculoskeletal: No new  myalgia/arthralgia   Exam:  BP 113/64 (BP Location: Left Arm, Patient Position: Sitting, Cuff Size: Large)   Pulse 77   Temp 98.3 F (36.8 C) (Oral)   Wt 254 lb 0.6 oz (115.2 kg)   BMI 32.62 kg/m   Constitutional: VS see above. General Appearance: alert, well-developed, well-nourished, NAD  Eyes: Normal lids and conjunctive, non-icteric sclera  Ears, Nose, Mouth, Throat: MMM, Normal external inspection ears/nares/mouth/lips/gums.  Neck: No masses, trachea midline.   Respiratory: Normal respiratory effort.   Musculoskeletal: Gait normal. Symmetric and independent movement of all extremities  Loss of lumbar lordosis  Pronounced thoracic kyphosis  TART changes and tenderness paraspinal musculature lumbar and thoracic region   Neurological: Normal balance/coordination. No tremor.  Skin: warm, dry, intact.   Psychiatric: Normal judgment/insight. Normal mood and affect. Oriented x3.   Visit summary with medication list and pertinent instructions was printed for patient to review, advised to alert [name] if any changes needed. All questions at time of visit were answered - patient instructed to contact office with any additional concerns. ER/RTC precautions were reviewed with the patient and understanding verbalized.   Follow-up plan: Return for repeat manipulations prn.   Please note: voice recognition software was used to produce this document, and typos may escape review. Please contact Dr. Sheppard Coil for any needed clarifications.

## 2017-07-11 ENCOUNTER — Ambulatory Visit: Payer: Medicare Other | Admitting: Osteopathic Medicine

## 2017-07-11 ENCOUNTER — Encounter: Payer: Self-pay | Admitting: Physical Therapy

## 2017-07-16 ENCOUNTER — Ambulatory Visit (INDEPENDENT_AMBULATORY_CARE_PROVIDER_SITE_OTHER): Payer: Medicare Other | Admitting: Physical Therapy

## 2017-07-16 ENCOUNTER — Encounter: Payer: Self-pay | Admitting: Physical Therapy

## 2017-07-16 ENCOUNTER — Encounter: Payer: Self-pay | Admitting: Osteopathic Medicine

## 2017-07-16 ENCOUNTER — Ambulatory Visit (INDEPENDENT_AMBULATORY_CARE_PROVIDER_SITE_OTHER): Payer: Medicare Other | Admitting: Osteopathic Medicine

## 2017-07-16 VITALS — BP 123/58 | HR 79 | Temp 98.2°F | Wt 255.2 lb

## 2017-07-16 DIAGNOSIS — I25119 Atherosclerotic heart disease of native coronary artery with unspecified angina pectoris: Secondary | ICD-10-CM

## 2017-07-16 DIAGNOSIS — M9903 Segmental and somatic dysfunction of lumbar region: Secondary | ICD-10-CM

## 2017-07-16 DIAGNOSIS — M51369 Other intervertebral disc degeneration, lumbar region without mention of lumbar back pain or lower extremity pain: Secondary | ICD-10-CM

## 2017-07-16 DIAGNOSIS — G8929 Other chronic pain: Secondary | ICD-10-CM

## 2017-07-16 DIAGNOSIS — M4005 Postural kyphosis, thoracolumbar region: Secondary | ICD-10-CM | POA: Diagnosis not present

## 2017-07-16 DIAGNOSIS — M5136 Other intervertebral disc degeneration, lumbar region: Secondary | ICD-10-CM

## 2017-07-16 DIAGNOSIS — M4004 Postural kyphosis, thoracic region: Secondary | ICD-10-CM | POA: Diagnosis not present

## 2017-07-16 DIAGNOSIS — M545 Low back pain: Secondary | ICD-10-CM

## 2017-07-16 NOTE — Therapy (Signed)
[facility] [place] [address] [place], [place], [address] Phone: [phone]   Fax:  [phone]  Physical Therapy Treatment  Patient Details  Name: Justin Browning MRN: [id number] Date of Birth: 01/12/1946 Referring Provider: Dr Emeterio Reeve   Encounter Date: 07/16/2017  PT End of Session - 07/16/17 1157    Visit Number  2    Number of Visits  6    Date for PT Re-Evaluation  08/13/17    PT Start Time  1100    PT Stop Time  1155    PT Time Calculation (min)  55 min    Equipment Utilized During Treatment  Gait belt during DGI only    Activity Tolerance  Patient tolerated treatment well    Behavior During Therapy  [facility] for tasks assessed/performed       Past Medical History:  Diagnosis Date  . Abdominal aortic aneurysm (AAA) without rupture ([place]) 01/31/2017  . Anemia of chronic disease 10/11/2016  . Aneurysm ([place])    multiple   . Atrial flutter ([place]) 09/23/2014  . CAD (coronary artery disease) 10/11/2016   Overview:  Non STEMI 1/06 Cardiac Catheterization 1/06-Total occlusion of the LAD,OM1,LCX,RPDA CABG 02/15/04(LIMA-LAD,SVG-OM1,SVG-OM2-OM3) Postoperative course complicated by recurrent ventricular and atrial arrhythmias.Acute abdomen requiring exploratory laparotomy, partial resection of the rectosigmoid colon, diverting colostomy s/p revision. AICD 2/06  . Chronic atrial fibrillation ([place]) 10/11/2016  . Chronic congestive heart failure ([place]) 10/11/2016  . Diabetes mellitus without complication ([place])   . Dyspnea on exertion 05/13/2015  . ESRD (end stage renal disease) on dialysis ([place])   . ESRD on dialysis ([place]) 10/11/2016  . Hernia of abdominal cavity   . History of adenocarcinoma of lung 10/11/2016   s/p partial lobectomy right lung  . Hyperlipidemia   . Ischemic cardiomyopathy 10/09/2016  . Lumbar degenerative disc disease 10/11/2016  . Mixed hyperlipidemia 10/11/2016  . Sleep apnea   . Thrombocytopenia ([place]) 10/11/2016  .  Type 2 diabetes mellitus with chronic kidney disease on chronic dialysis, without long-term current use of insulin ([place]) 10/11/2016  . VT (ventricular tachycardia) ([place]) 05/12/2010   Overview:  AICD 03/09/04 [employer]    Past Surgical History:  Procedure Laterality Date  . CARDIAC DEFIBRILLATOR PLACEMENT    . CARDIAC SURGERY     Quad Bypass  . COLON SURGERY    . CORONARY ARTERY BYPASS GRAFT    . CORONARY ARTERY BYPASS GRAFT    . [place]  . KNEE SURGERY Bilateral   . LUNG LOBECTOMY Right   . PARTIAL COLECTOMY    . REPLACEMENT TOTAL KNEE Left     There were no vitals filed for this visit.  Subjective Assessment - 07/16/17 1119    Subjective  Pt reports 2-3 LBP today but still having leg weakness and relays " I am not in good health"    Pertinent History  dialysis M/W/F, h/o lung CA 2017 with lobectomy, bilat TKA, Rt hip ORIF in the 60's, defibrillator for heart - not a pacemaker per pt.     Diagnostic tests  x-ray, old MRI    Patient Stated Goals  walk better - have his knees and back feel better    Currently in Pain?  Yes    Pain Score  3     Pain Location  Back    Pain Orientation  Right;Left;Lower    Pain Descriptors / Indicators  Aching    Pain Type  Chronic pain    Pain Onset  More than a month ago    Pain Frequency  Intermittent    Aggravating Factors   bending, lifting    Pain Relieving Factors  rest                       [facility] Adult PT Treatment/Exercise - 07/16/17 0001      Posture/Postural Control   Posture/Postural Control  Postural limitations    Postural Limitations  Forward head;Rounded Shoulders;Flexed trunk      Dynamic Gait Index   Level Surface  Mild Impairment    Change in Gait Speed  Mild Impairment    Gait with Horizontal Head Turns  Mild Impairment    Gait with Vertical Head Turns  Mild Impairment    Gait and Pivot Turn  Mild Impairment    Step Over Obstacle  Mild Impairment    Step Around Obstacles  Mild  Impairment    Steps  Mild Impairment    Total Score  16      Exercises   Exercises  Lumbar      Lumbar Exercises: Stretches   Active Hamstring Stretch  Right;Left;2 reps;30 seconds    Single Knee to Chest Stretch  Right;Left;3 reps;10 seconds    Lower Trunk Rotation  5 reps;10 seconds    Piriformis Stretch  Right;Left;2 reps;30 seconds      Lumbar Exercises: Aerobic   Nustep  L5 5 min      Lumbar Exercises: Standing   Other Standing Lumbar Exercises  hip ext/abd X 15 bilat      Lumbar Exercises: Seated   Sit to Stand  10 reps ax pad in chair    Sit to Stand Limitations  needs ax pad in chair             PT Education - 07/16/17 1156    Education Details  rationale and technique for new exercises added today as well as need for balance test    Person(s) Educated  Patient    Methods  Demonstration;Verbal cues    Comprehension  Verbalized understanding;Returned demonstration          PT Long Term Goals - 07/16/17 1204      PT LONG TERM GOAL #1   Title  I with advanced HEP for strengthening and balance ( 08/13/17)     Time  6    Period  Weeks    Status  On-going      PT LONG TERM GOAL #2   Title  increase strength bilat LE's =/> 5-/5 ( 08/13/17)     Time  6    Period  Weeks    Status  On-going      PT LONG TERM GOAL #3   Title  report =/> 50% reduction of pain ( 08/13/17)     Time  6    Period  Weeks    Status  On-going      PT LONG TERM GOAL #4   Title  improve FOTO =/< 45% limited ( 08/13/17)     Time  6    Period  Weeks    Status  On-going      PT LONG TERM GOAL #5   Title  Pt will increase DGI from 16 to 19 or more to show improved dynamic balance and decrease risk of falling. 4 weeks (08/13/17)            Plan - 07/16/17 1158    Clinical Impression Statement  Pt  became uncomfortable and had more back pain with prolonged supine position during stretching program. He was then moved to sitting position and had good tolerance with Hamstring  stretch in sitting followed by sit to stands (needed Airex pad to raise chair height). Standing hip abd/ext strengthening was added with good tolerance. He performed DGI with score of 16 showing he is at an increased risk for falling. New goal made today for balance. He declined any modalties at end of session    Clinical Presentation  Evolving    Clinical Decision Making  Moderate    Rehab Potential  Fair    PT Frequency  1x / week    PT Duration  6 weeks    PT Treatment/Interventions  Gait training;Neuromuscular re-education;Manual techniques;Functional mobility training;Moist Heat;Ultrasound;Therapeutic activities;Patient/family education;Therapeutic exercise;Cryotherapy    PT Next Visit Plan  Progress LE strength, core strenght, lumbar stretching and balance as tolerated    Consulted and Agree with Plan of Care  Patient       Patient will benefit from skilled therapeutic intervention in order to improve the following deficits and impairments:  Abnormal gait, Pain, Improper body mechanics, Postural dysfunction, Decreased coordination, Decreased activity tolerance, Decreased range of motion, Decreased strength, Obesity, Difficulty walking, Decreased safety awareness, Decreased balance  Visit Diagnosis: Chronic bilateral low back pain without sciatica     Problem List Patient Active Problem List   Diagnosis Date Noted  . Pleural effusion on right 05/16/2017  . Restrictive lung disease 05/14/2017  . History of pneumonia 03/29/2017  . Cough 03/29/2017  . Iliac artery aneurysm, bilateral ([place]) 02/24/2017  . Osteoarthritis of multiple joints 02/08/2017  . Requires continuous at home supplemental oxygen 02/08/2017  . Encounter for long-term (current) use of high-risk medication 01/31/2017  . Abdominal aortic aneurysm (AAA) without rupture ([place]) 01/31/2017  . History of adenocarcinoma of lung 10/11/2016  . ESRD on dialysis ([place]) 10/11/2016  . Paroxysmal A-fib ([place]) 10/11/2016  . Type 2  diabetes mellitus with chronic kidney disease on chronic dialysis, without long-term current use of insulin ([place]) 10/11/2016  . Mixed hyperlipidemia 10/11/2016  . Artificial cardiac pacemaker 10/11/2016  . Coronary artery disease involving native coronary artery of native heart without angina pectoris 10/11/2016  . Anemia of chronic disease 10/11/2016  . Thrombocytopenia ([place]) 10/11/2016  . AICD (automatic cardioverter/defibrillator) present 10/11/2016  . Chronic congestive heart failure ([place]) 10/11/2016  . Hx of CABG 10/11/2016  . Non-gaseous abdominal distention 10/11/2016  . Enlarged prostate 10/11/2016  . Lumbar degenerative disc disease 10/11/2016  . Ischemic cardiomyopathy 10/09/2016  . Chest pain 06/19/2016  . Dyspnea on exertion 05/13/2015  . Long term current use of anticoagulants with INR goal of 2.0-3.0 05/13/2015  . CKD (chronic kidney disease) stage 5, GFR less than 15 ml/min (HCC) 01/13/2014    Debbe Odea PT, DPT 07/16/2017, 12:07 PM  [facility] [place] [employer] [facility] [place], [place], [address] Phone: [phone]   Fax:  [phone]  Name: Justin Browning MRN: [id number] Date of Birth: 03-20-1945

## 2017-07-17 ENCOUNTER — Encounter: Payer: Self-pay | Admitting: Osteopathic Medicine

## 2017-07-17 NOTE — Progress Notes (Signed)
HPI: Justin Browning is a 72 y.o. male who  has a past medical history of Abdominal aortic aneurysm (AAA) without rupture ([place]) (01/31/2017), Anemia of chronic disease (10/11/2016), Aneurysm ([place]), Atrial flutter ([place]) (09/23/2014), CAD (coronary artery disease) (10/11/2016), Chronic atrial fibrillation ([place]) (10/11/2016), Chronic congestive heart failure ([place]) (10/11/2016), Diabetes mellitus without complication ([place]), Dyspnea on exertion (05/13/2015), ESRD (end stage renal disease) on dialysis [facility]), ESRD on dialysis ([place]) (10/11/2016), Hernia of abdominal cavity, History of adenocarcinoma of lung (10/11/2016), Hyperlipidemia, Ischemic cardiomyopathy (10/09/2016), Lumbar degenerative disc disease (10/11/2016), Mixed hyperlipidemia (10/11/2016), Sleep apnea, Thrombocytopenia ([place]) (10/11/2016), Type 2 diabetes mellitus with chronic kidney disease on chronic dialysis, without long-term current use of insulin ([place]) (10/11/2016), and VT (ventricular tachycardia) ([place]) (05/12/2010).  he presents to [facility] today, 07/17/17,  for chief complaint of:  Back pain - OMT   Been following with me for myofascial release and FPR treatments of somatic dysfunctino of lumbar spine, same lower back pain today. He is thinking of purchasing a device to help with posture, we discussed this a bit as below.  He has tried being better about standing and sitting up straighter but finds that he still ends up slouching forward a bit.   Past medical history, surgical history, and family history reviewed.  Current medication list and allergy/intolerance information reviewed.   (See remainder of HPI, ROS, Phys Exam below)    ASSESSMENT/PLAN: The primary encounter diagnosis was Chronic bilateral low back pain without sciatica. Diagnoses of Somatic dysfunction of spine, lumbar, Postural kyphosis of lumbar region, Postural kyphosis, thoracic region, and Lumbar degenerative disc disease were also pertinent to  this visit.  OMT applied to lumbar and thoracic region: FPR, MFR to (+) patient relief   Dicsussed postural changes when sitting: lumbar support. Also strengthening core musculature.      Follow-up plan: Return for Repeat treatment as needed.     ############################################ ############################################ ############################################ ############################################    Outpatient Encounter Medications as of 07/16/2017  Medication Sig  . albuterol (PROVENTIL HFA;VENTOLIN HFA) 108 (90 Base) MCG/ACT inhaler Inhale 1-2 puffs into the lungs every 4 (four) hours as needed for wheezing or shortness of breath (bronchospasm).  [name] amiodarone (PACERONE) 200 MG tablet Take 200 mg by mouth daily.  [name] apixaban (ELIQUIS) 5 MG TABS tablet Take 1 tablet (5 mg total) by mouth 2 (two) times daily.  [name] aspirin 81 MG chewable tablet Chew by mouth daily.  [name] atorvastatin (LIPITOR) 20 MG tablet Take 1 tablet (20 mg total) by mouth at bedtime.  . carvedilol (COREG) 6.25 MG tablet Take 3.125 mg by mouth 2 (two) times daily with a meal.   . Cholecalciferol (VITAMIN D3) 1000 units CAPS Vitamin D3 1,000 unit capsule  Take by oral route.  . gabapentin (NEURONTIN) 300 MG capsule Take 300 mg by mouth daily.  . MULTIPLE VITAMIN PO Take by mouth.  . NONFORMULARY OR COMPOUNDED ITEM Home oxygen 3L per minute on nasal cannula  . OXYGEN Inhale 2 L into the lungs as needed.  . pantoprazole (PROTONIX) 20 MG tablet Take 2 tablets (40 mg total) by mouth daily.  . sevelamer carbonate (RENVELA) 800 MG tablet Take 800 mg by mouth 3 (three) times daily with meals.  . torsemide (DEMADEX) 100 MG tablet Take 50 mg by mouth. Take 1/2 tab PO on nondialysis days  . TRADJENTA 5 MG TABS tablet TAKE 1 TABLET(5 MG) BY MOUTH DAILY   No facility-administered encounter medications on file as of 07/16/2017.  No Known Allergies    Review of Systems:  Constitutional: No recent  illness  HEENT: No  headache, no vision change  Cardiac: No  chest pain  Respiratory:  No  shortness of breath. Musculoskeletal: No new myalgia/arthralgia   Exam:  BP (!) 123/58 (BP Location: Left Arm, Patient Position: Sitting, Cuff Size: Normal)   Pulse 79   Temp 98.2 F (36.8 C) (Oral)   Wt 255 lb 3.2 oz (115.8 kg)   BMI 32.77 kg/m   Constitutional: VS see above. General Appearance: alert, well-developed, well-nourished, NAD  Eyes: Normal lids and conjunctive, non-icteric sclera  Ears, Nose, Mouth, Throat: MMM, Normal external inspection ears/nares/mouth/lips/gums.  Neck: No masses, trachea midline.   Respiratory: Normal respiratory effort.   Musculoskeletal: Gait normal. Symmetric and independent movement of all extremities  Loss of lumbar lordosis  Pronounced thoracic kyphosis  TART changes and tenderness paraspinal musculature lumbar and thoracic region -stable from previous exam 07/04/17  Neurological: Normal balance/coordination. No tremor.  Skin: warm, dry, intact.   Psychiatric: Normal judgment/insight. Normal mood and affect. Oriented x3.   Visit summary with medication list and pertinent instructions was printed for patient to review, advised to alert [name] if any changes needed. All questions at time of visit were answered - patient instructed to contact office with any additional concerns. ER/RTC precautions were reviewed with the patient and understanding verbalized.   Follow-up plan: Return for Repeat treatment as needed.   Please note: voice recognition software was used to produce this document, and typos may escape review. Please contact Dr. Sheppard Coil for any needed clarifications.

## 2017-07-19 ENCOUNTER — Ambulatory Visit: Payer: Medicare Other | Admitting: Osteopathic Medicine

## 2017-07-25 ENCOUNTER — Encounter: Payer: Self-pay | Admitting: Osteopathic Medicine

## 2017-07-25 ENCOUNTER — Ambulatory Visit (INDEPENDENT_AMBULATORY_CARE_PROVIDER_SITE_OTHER): Payer: Medicare Other | Admitting: Physical Therapy

## 2017-07-25 ENCOUNTER — Ambulatory Visit (INDEPENDENT_AMBULATORY_CARE_PROVIDER_SITE_OTHER): Payer: Medicare Other | Admitting: Osteopathic Medicine

## 2017-07-25 VITALS — BP 94/76 | HR 79 | Temp 97.8°F | Wt 258.4 lb

## 2017-07-25 DIAGNOSIS — M6281 Muscle weakness (generalized): Secondary | ICD-10-CM | POA: Diagnosis not present

## 2017-07-25 DIAGNOSIS — M4005 Postural kyphosis, thoracolumbar region: Secondary | ICD-10-CM

## 2017-07-25 DIAGNOSIS — M545 Low back pain, unspecified: Secondary | ICD-10-CM

## 2017-07-25 DIAGNOSIS — G8929 Other chronic pain: Secondary | ICD-10-CM | POA: Diagnosis not present

## 2017-07-25 DIAGNOSIS — M9903 Segmental and somatic dysfunction of lumbar region: Secondary | ICD-10-CM

## 2017-07-25 DIAGNOSIS — R2689 Other abnormalities of gait and mobility: Secondary | ICD-10-CM | POA: Diagnosis not present

## 2017-07-25 DIAGNOSIS — M5136 Other intervertebral disc degeneration, lumbar region: Secondary | ICD-10-CM

## 2017-07-25 NOTE — Patient Instructions (Signed)

## 2017-07-25 NOTE — Progress Notes (Signed)
HPI: Justin Browning is a 72 y.o. male who  has a past medical history of Abdominal aortic aneurysm (AAA) without rupture ([place]) (01/31/2017), Anemia of chronic disease (10/11/2016), Aneurysm ([place]), Atrial flutter ([place]) (09/23/2014), CAD (coronary artery disease) (10/11/2016), Chronic atrial fibrillation ([place]) (10/11/2016), Chronic congestive heart failure ([place]) (10/11/2016), Diabetes mellitus without complication ([place]), Dyspnea on exertion (05/13/2015), ESRD (end stage renal disease) on dialysis [facility]), ESRD on dialysis ([place]) (10/11/2016), Hernia of abdominal cavity, History of adenocarcinoma of lung (10/11/2016), Hyperlipidemia, Ischemic cardiomyopathy (10/09/2016), Lumbar degenerative disc disease (10/11/2016), Mixed hyperlipidemia (10/11/2016), Sleep apnea, Thrombocytopenia ([place]) (10/11/2016), Type 2 diabetes mellitus with chronic kidney disease on chronic dialysis, without long-term current use of insulin ([place]) (10/11/2016), and VT (ventricular tachycardia) ([place]) (05/12/2010).  he presents to [facility] today, 07/25/17,  for chief complaint of:  Back pain - OMT   Been following with me for myofascial release and FPR treatments of somatic df of lumbar spine, same lower back pain today, feeling a bit better than usual. Discussed water exercises, he hasn't been getting to the [facility] as much as he'd like to.    Past medical history, surgical history, and family history reviewed.  Current medication list and allergy/intolerance information reviewed.   (See remainder of HPI, ROS, Phys Exam below)    ASSESSMENT/PLAN: The primary encounter diagnosis was Chronic bilateral low back pain without sciatica. Diagnoses of Somatic dysfunction of spine, lumbar, Postural kyphosis of lumbar region, and Lumbar degenerative disc disease were also pertinent to this visit.  OMT applied to lumbar and thoracic region: FPR, MFR to (+) patient relief   Dicsussed postural changes when sitting: lumbar  support. Also strengthening core musculature.  He is also participating in physical therapy which has been helpful though he has some difficulty doing the home exercises, cannot seem to get himself up off the floor but cannot do exercises on a soft surface such as bed or couch.     Follow-up plan: No follow-ups on file.     ############################################ ############################################ ############################################ ############################################    Outpatient Encounter Medications as of 07/25/2017  Medication Sig  . albuterol (PROVENTIL HFA;VENTOLIN HFA) 108 (90 Base) MCG/ACT inhaler Inhale 1-2 puffs into the lungs every 4 (four) hours as needed for wheezing or shortness of breath (bronchospasm).  [name] amiodarone (PACERONE) 200 MG tablet Take 200 mg by mouth daily.  [name] apixaban (ELIQUIS) 5 MG TABS tablet Take 1 tablet (5 mg total) by mouth 2 (two) times daily.  [name] aspirin 81 MG chewable tablet Chew by mouth daily.  [name] atorvastatin (LIPITOR) 20 MG tablet Take 1 tablet (20 mg total) by mouth at bedtime.  . carvedilol (COREG) 6.25 MG tablet Take 3.125 mg by mouth 2 (two) times daily with a meal.   . Cholecalciferol (VITAMIN D3) 1000 units CAPS Vitamin D3 1,000 unit capsule  Take by oral route.  . gabapentin (NEURONTIN) 300 MG capsule Take 300 mg by mouth daily.  . MULTIPLE VITAMIN PO Take by mouth.  . NONFORMULARY OR COMPOUNDED ITEM Home oxygen 3L per minute on nasal cannula  . OXYGEN Inhale 2 L into the lungs as needed.  . pantoprazole (PROTONIX) 20 MG tablet Take 2 tablets (40 mg total) by mouth daily.  . sevelamer carbonate (RENVELA) 800 MG tablet Take 800 mg by mouth 3 (three) times daily with meals.  . torsemide (DEMADEX) 100 MG tablet Take 50 mg by mouth. Take 1/2 tab PO on nondialysis days  . TRADJENTA 5 MG TABS tablet TAKE 1  TABLET(5 MG) BY MOUTH DAILY   No facility-administered encounter medications on file as of 07/25/2017.     No Known Allergies    Review of Systems:  Constitutional: No recent illness  HEENT: No  headache, no vision change  Cardiac: No  chest pain  Respiratory:  No  shortness of breath.   Musculoskeletal: No new myalgia/arthralgia   Exam:  BP 94/76   Pulse 79   Temp 97.8 F (36.6 C) (Oral)   Wt 258 lb 6.4 oz (117.2 kg)   SpO2 97%   BMI 33.18 kg/m   Constitutional: VS see above. General Appearance: alert, well-developed, well-nourished, NAD  Eyes: Normal lids and conjunctive, non-icteric sclera  Ears, Nose, Mouth, Throat: MMM, Normal external inspection ears/nares/mouth/lips/gums.  Neck: No masses, trachea midline.   Respiratory: Normal respiratory effort.   Musculoskeletal: Gait normal. Symmetric and independent movement of all extremities  Loss of lumbar lordosis  Pronounced thoracic kyphosis  Minimal tenderness paraspinal musculature lumbar and thoracic region -a bit improved from previous exam 07/04/17  Atrophic lumbar and gluteal musculature, notable abdominal musculature abnormality w/ hernia   Neurological: Normal balance/coordination. No tremor.  Skin: warm, dry, intact.   Psychiatric: Normal judgment/insight. Normal mood and affect. Oriented x3.   Visit summary with medication list and pertinent instructions was printed for patient to review, advised to alert [name] if any changes needed. All questions at time of visit were answered - patient instructed to contact office with any additional concerns. ER/RTC precautions were reviewed with the patient and understanding verbalized.   Follow-up plan: No follow-ups on file.   Please note: voice recognition software was used to produce this document, and typos may escape review. Please contact Dr. Sheppard Coil for any needed clarifications.

## 2017-07-25 NOTE — Therapy (Addendum)
[place], [place], [address] Phone: [phone]   Fax:  [phone]  Physical Therapy Treatment  Patient Details  Name: Justin Browning MRN: [id number] Date of Birth: 30-Jul-1945 Referring Provider: Dr. Emeterio Reeve   Encounter Date: 07/25/2017  PT End of Session - 07/25/17 1509    Visit Number  3    Number of Visits  6    Date for PT Re-Evaluation  08/13/17    PT Start Time  1450    PT Stop Time  1525    PT Time Calculation (min)  35 min       Past Medical History:  Diagnosis Date  . Abdominal aortic aneurysm (AAA) without rupture ([place]) 01/31/2017  . Anemia of chronic disease 10/11/2016  . Aneurysm ([place])    multiple   . Atrial flutter ([place]) 09/23/2014  . CAD (coronary artery disease) 10/11/2016   Overview:  Non STEMI 1/06 Cardiac Catheterization 1/06-Total occlusion of the LAD,OM1,LCX,RPDA CABG 02/15/04(LIMA-LAD,SVG-OM1,SVG-OM2-OM3) Postoperative course complicated by recurrent ventricular and atrial arrhythmias.Acute abdomen requiring exploratory laparotomy, partial resection of the rectosigmoid colon, diverting colostomy s/p revision. AICD 2/06  . Chronic atrial fibrillation ([place]) 10/11/2016  . Chronic congestive heart failure ([place]) 10/11/2016  . Diabetes mellitus without complication ([place])   . Dyspnea on exertion 05/13/2015  . ESRD (end stage renal disease) on dialysis ([place])   . ESRD on dialysis ([place]) 10/11/2016  . Hernia of abdominal cavity   . History of adenocarcinoma of lung 10/11/2016   s/p partial lobectomy right lung  . Hyperlipidemia   . Ischemic cardiomyopathy 10/09/2016  . Lumbar degenerative disc disease 10/11/2016  . Mixed hyperlipidemia 10/11/2016  . Sleep apnea   . Thrombocytopenia ([place]) 10/11/2016  . Type 2 diabetes mellitus with chronic kidney disease on chronic dialysis, without long-term current use of insulin ([place]) 10/11/2016  . VT (ventricular tachycardia) ([place]) 05/12/2010    Overview:  AICD 03/09/04 [employer]    Past Surgical History:  Procedure Laterality Date  . CARDIAC DEFIBRILLATOR PLACEMENT    . CARDIAC SURGERY     [place]ad Bypass  . COLON SURGERY    . CORONARY ARTERY BYPASS GRAFT    . CORONARY ARTERY BYPASS GRAFT    . [place]  . KNEE SURGERY Bilateral   . LUNG LOBECTOMY Right   . PARTIAL COLECTOMY    . REPLACEMENT TOTAL KNEE Left     There were no vitals filed for this visit.  Subjective Assessment - 07/25/17 1453    Subjective  Pt reports "today, my back is pretty good".  He just had an adjustment with Dr. Sheppard Coil.  He states his balance continues to be limited (ambulates with cane) and his back bothers him if he tries lifting items >5# from floor.     Patient Stated Goals  walk better - have his knees and back feel better    Currently in Pain?  No/denies    Pain Score  0-No pain         [facility] PT Assessment - 07/25/17 0001      Assessment   Medical Diagnosis  Lumbar DDD    Referring Provider  Dr. Emeterio Reeve    Onset Date/Surgical Date  07/03/14    Next MD Visit  PRN        [facility] Adult PT Treatment/Exercise - 07/25/17 0001      Lumbar Exercises: Stretches   Passive Hamstring Stretch  Right;Left;2 reps;30 seconds seated  Piriformis Stretch  Right;Left;2 reps;30 seconds seated      Lumbar Exercises: Standing   Functional Squats  5 reps holding counter    Other Standing Lumbar Exercises  hip ext/abd X 10 bilat cues for posture and core engagement      Lumbar Exercises: Seated   Other Seated Lumbar Exercises  core engagement with lap press x 5 sec x 10 reps - multiple cues to stay on task and for form.       Manual Therapy   Manual therapy comments  biofreeze applied to bilat low back with pt permission.              PT Education - 07/25/17 1819    Education Details  Importance of regular stretching and core strengthening for reduction of LBP/ prevention of re-injury    Person(s) Educated  Patient     Methods  Explanation    Comprehension  Verbalized understanding          PT Long Term Goals - 07/16/17 1204      PT LONG TERM GOAL #1   Title  I with advanced HEP for strengthening and balance ( 08/13/17)     Time  6    Period  Weeks    Status  On-going      PT LONG TERM GOAL #2   Title  increase strength bilat LE's =/> 5-/5 ( 08/13/17)     Time  6    Period  Weeks    Status  On-going      PT LONG TERM GOAL #3   Title  report =/> 50% reduction of pain ( 08/13/17)     Time  6    Period  Weeks    Status  On-going      PT LONG TERM GOAL #4   Title  improve FOTO =/< 45% limited ( 08/13/17)     Time  6    Period  Weeks    Status  On-going      PT LONG TERM GOAL #5   Title  Pt will increase DGI from 16 to 19 or more to show improved dynamic balance and decrease risk of falling. 4 weeks (08/13/17)            Plan - 07/25/17 1507    Clinical Impression Statement  Pt required redirection multiple times to keep on task with therapeutic exercise.  He required tactile/ VC for proper engagement of TA engagement.  He reported some increase in LBP with standing exercises; reduced with rest and use of biofreeze applied to back at end of session.  No new goals met yet.     Rehab Potential  Fair    PT Frequency  1x / week    PT Duration  6 weeks    PT Treatment/Interventions  Gait training;Neuromuscular re-education;Manual techniques;Functional mobility training;Moist Heat;Ultrasound;Therapeutic activities;Patient/family education;Therapeutic exercise;Cryotherapy    PT Next Visit Plan  Progress LE strength, core strenght, lumbar stretching and balance as tolerated. assess response to biofreeze.     Consulted and Agree with Plan of Care  Patient       Patient will benefit from skilled therapeutic intervention in order to improve the following deficits and impairments:  Abnormal gait, Pain, Improper body mechanics, Postural dysfunction, Decreased coordination, Decreased activity  tolerance, Decreased range of motion, Decreased strength, Obesity, Difficulty walking, Decreased safety awareness, Decreased balance  Visit Diagnosis: Chronic bilateral low back pain without sciatica  Muscle weakness (generalized)  Other abnormalities of  gait and mobility     Problem List Patient Active Problem List   Diagnosis Date Noted  . Pleural effusion on right 05/16/2017  . Restrictive lung disease 05/14/2017  . History of pneumonia 03/29/2017  . Cough 03/29/2017  . Iliac artery aneurysm, bilateral ([place]) 02/24/2017  . Osteoarthritis of multiple joints 02/08/2017  . Requires continuous at home supplemental oxygen 02/08/2017  . Encounter for long-term (current) use of high-risk medication 01/31/2017  . Abdominal aortic aneurysm (AAA) without rupture ([place]) 01/31/2017  . History of adenocarcinoma of lung 10/11/2016  . ESRD on dialysis ([place]) 10/11/2016  . Paroxysmal A-fib ([place]) 10/11/2016  . Type 2 diabetes mellitus with chronic kidney disease on chronic dialysis, without long-term current use of insulin ([place]) 10/11/2016  . Mixed hyperlipidemia 10/11/2016  . Artificial cardiac pacemaker 10/11/2016  . Coronary artery disease involving native coronary artery of native heart without angina pectoris 10/11/2016  . Anemia of chronic disease 10/11/2016  . Thrombocytopenia ([place]) 10/11/2016  . AICD (automatic cardioverter/defibrillator) present 10/11/2016  . Chronic congestive heart failure ([place]) 10/11/2016  . Hx of CABG 10/11/2016  . Non-gaseous abdominal distention 10/11/2016  . Enlarged prostate 10/11/2016  . Lumbar degenerative disc disease 10/11/2016  . Ischemic cardiomyopathy 10/09/2016  . Chest pain 06/19/2016  . Dyspnea on exertion 05/13/2015  . Long term current use of anticoagulants with INR goal of 2.0-3.0 05/13/2015  . CKD (chronic kidney disease) stage 5, GFR less than 15 ml/min ([facility]) 01/13/2014   Kerin Perna, PTA 07/25/17 6:26 PM  [place], [place], [address] Phone: [phone]   Fax:  [phone]  Name: Justin Browning MRN: [id number] Date of Birth: 05-21-1945  PHYSICAL THERAPY DISCHARGE SUMMARY  Visits from Start of Care: 3  Current functional level related to goals / functional outcomes: See above   Remaining deficits: See above   Education / Equipment: Continue HEP Plan: Patient agrees to discharge.  Patient goals were not met. Patient is being discharged due to not returning since the last visit.  ?????

## 2017-07-26 ENCOUNTER — Ambulatory Visit: Payer: Medicare Other | Admitting: Osteopathic Medicine

## 2017-07-30 ENCOUNTER — Encounter: Payer: Medicare Other | Admitting: Physical Therapy

## 2017-08-05 ENCOUNTER — Ambulatory Visit (INDEPENDENT_AMBULATORY_CARE_PROVIDER_SITE_OTHER): Payer: Medicare Other

## 2017-08-05 ENCOUNTER — Ambulatory Visit (INDEPENDENT_AMBULATORY_CARE_PROVIDER_SITE_OTHER): Payer: Medicare Other | Admitting: Physician Assistant

## 2017-08-05 ENCOUNTER — Encounter: Payer: Self-pay | Admitting: Physician Assistant

## 2017-08-05 VITALS — BP 109/66 | HR 79 | Temp 98.1°F | Resp 85 | Wt 256.0 lb

## 2017-08-05 DIAGNOSIS — R058 Other specified cough: Secondary | ICD-10-CM

## 2017-08-05 DIAGNOSIS — Z9981 Dependence on supplemental oxygen: Secondary | ICD-10-CM | POA: Insufficient documentation

## 2017-08-05 DIAGNOSIS — R05 Cough: Secondary | ICD-10-CM

## 2017-08-05 DIAGNOSIS — J984 Other disorders of lung: Secondary | ICD-10-CM | POA: Diagnosis not present

## 2017-08-05 DIAGNOSIS — R918 Other nonspecific abnormal finding of lung field: Secondary | ICD-10-CM | POA: Diagnosis not present

## 2017-08-05 DIAGNOSIS — I25119 Atherosclerotic heart disease of native coronary artery with unspecified angina pectoris: Secondary | ICD-10-CM | POA: Diagnosis not present

## 2017-08-05 DIAGNOSIS — I5042 Chronic combined systolic (congestive) and diastolic (congestive) heart failure: Secondary | ICD-10-CM

## 2017-08-05 DIAGNOSIS — J9611 Chronic respiratory failure with hypoxia: Secondary | ICD-10-CM | POA: Diagnosis not present

## 2017-08-05 DIAGNOSIS — I517 Cardiomegaly: Secondary | ICD-10-CM

## 2017-08-05 DIAGNOSIS — I5043 Acute on chronic combined systolic (congestive) and diastolic (congestive) heart failure: Secondary | ICD-10-CM

## 2017-08-05 NOTE — Patient Instructions (Addendum)
Chest x-ray today Increase Torsemide to full tablet tomorrow I will contact Nephrology about taking off some extra fluid on Wednesday, but I would recommend you mention this at Dialysis as well Follow-up with Dr. Darene Lamer on Thursday Follow-up sooner for fever, worsening shortness of breath, blood-tinged or purulent sputum    Heart Failure Exacerbation  Heart failure is a condition in which the heart does not fill up with enough blood, and therefore does not pump enough blood and oxygen to the body. When this happens, parts of the body do not get the blood and oxygen they need to function properly. This can cause symptoms such as breathing problems, fatigue, swelling, and confusion. Heart failure exacerbation refers to heart failure symptoms that get worse. The symptoms may get worse suddenly or develop slowly over time. Heart failure exacerbation is a serious medical problem that should be treated right away. What are the causes? A heart failure exacerbation can be triggered by:  Not taking your heart failure medicines correctly.  Infections.  Eating an unhealthy diet or a diet that is high in salt (sodium).  Drinking too much fluid.  Drinking alcohol.  Taking illegal drugs, such as cocaine or methamphetamine.  Not exercising.  Other causes include:  Other heart conditions such as an irregular heartbeat (arrhythmia).  Anemia.  Other medical problems, such as kidney failure.  Sometimes the cause of the exacerbation is not known. What are the signs or symptoms? When heart failure symptoms suddenly or slowly get worse, this may be a sign of heart failure exacerbation. Symptoms of heart failure include:  Breathing problems or shortness of breath.  Chronic coughing or wheezing.  Fatigue.  Nausea or lack of appetite.  Feeling light-headed.  Confusion or memory loss.  Increased heart rate or irregular heartbeat.  Buildup of fluid in the legs, ankles, feet, or  abdomen.  Difficulty breathing when lying down.  How is this diagnosed? This condition is diagnosed based on:  Your symptoms and medical history.  A physical exam.  You may also have tests, including:  Electrocardiogram (ECG). This test measures the electrical activity of your heart.  Echocardiogram. This test uses sound waves to take a picture of your heart to see how well it works.  Blood tests.  Imaging tests, such as: ? Chest X-ray. ? MRI. ? Ultrasound.  Stress test. This test examines how well your heart functions when you exercise. Your heart is monitored while you exercise on a treadmill or exercise bike. If you cannot exercise, medicines may be used to increase your heartbeat in place of exercise.  Cardiac catheterization. During this test, a thin, flexible tube (catheter) is inserted into a blood vessel and threaded up to your heart. This test allows your health care provider to check the arteries that lead to your heart (coronary arteries).  Right heart catheterization. During this test, the pressure in your heart is measured.  How is this treated? This condition may be treated by:  Adjusting your heart medicines.  Maintaining a healthy lifestyle. This includes: ? Eating a heart-healthy diet that is low in sodium. ? Not using any products that contain nicotine or tobacco, such as cigarettes and e-cigarettes. ? Regular exercise. ? Monitoring your fluid intake. ? Monitoring your weight and reporting changes to your health care provider.  Treating sleep apnea, if you have this condition.  Surgery. This may include: ? Implanting a device that helps both sides of your heart contract at the same time (cardiac resynchronization therapy device). This  can help with heart function and relieve heart failure symptoms. ? Implanting a device that can correct heart rhythm problems (implantable cardioverter defibrillator). ? Connecting a device to your heart to help it pump  blood (ventricular assist device). ? Heart transplant.  Follow these instructions at home: Medicines  Take over-the-counter and prescription medicines only as told by your health care provider.  Do not stop taking your medicines or change the amount you take. If you are having problems or side effects from your medicines, talk to your health care provider.  If you are having difficulty paying for your medicines, contact a social worker or your clinic. There are many programs to assist with medicine costs.  Talk to your health care provider before starting any new medicines or supplements.  Make sure your health care provider and pharmacist have a list of all the medicines you are taking. Eating and drinking  Avoid drinking alcohol.  Eat a heart-healthy diet as told by your health care provider. This includes: ? Plenty of fruits and vegetables. ? Lean proteins. ? Low-fat dairy. ? Whole grains. ? Foods that are low in sodium. Activity  Exercise regularly as told by your health care provider. Balance exercise with rest.  Ask your health care provider what activities are safe for you. This includes sexual activity, exercise, and daily tasks at home or work. Lifestyle  Do not use any products that contain nicotine or tobacco, such as cigarettes and e-cigarettes. If you need help quitting, ask your health care provider.  Maintain a healthy weight. Ask your health care provider what weight is healthy for you.  Consider joining a patient support group. This can help with emotional problems you may have, such as stress and anxiety. General instructions  Talk to your health care provider about flu and pneumonia vaccines.  Keep a list of medicines that you are taking. This may help in emergency situations.  Keep all follow-up visits as told by your health care provider. This is important. Contact a health care provider if:  You have questions about your medicines or you miss a  dose.  You feel anxious, depressed, or stressed.  You have swelling in your feet, ankles, legs, or abdomen.  You have shortness of breath during activity or exercise.  You have a cough.  You have a fever.  You have trouble sleeping.  You gain 2-3 lb (1-1.4 kg) in 24 hours or 5 lb (2.3 kg) in a week. Get help right away if:  You have chest pain.  You have shortness of breath while resting.  You have severe fatigue.  You are confused.  You have severe dizziness.  You have a rapid or irregular heartbeat.  You have nausea or you vomit.  You have a cough that is worse at night or you cannot lie flat.  You have a cough that will not go away.  You have severe depression or sadness. Summary  When heart failure symptoms get worse, it is called heart failure exacerbation.  Common causes of this condition include taking medicines incorrectly, infections, and drinking alcohol.  This condition may be treated by adjusting medicines, maintaining a healthy lifestyle, or surgery.  Do not stop taking your medicines or change the amount you take. If you are having problems or side effects from your medicines, talk to your health care provider. This information is not intended to replace advice given to you by your health care provider. Make sure you discuss any questions you have with  your health care provider. Document Released: 05/15/2016 Document Revised: 05/15/2016 Document Reviewed: 05/15/2016 Elsevier Interactive Patient Education  2018 [employer].

## 2017-08-05 NOTE — Progress Notes (Signed)
Subjective:    Patient ID: Justin Browning, male    DOB: 1945-06-10, 72 y.o.   MRN: [id number]  HPI  Pt is a 72 yr old male with an extensive PMH of ESRD on dialysis, afib on Eliquis, hx of adenocarcinoma of the lung s/p RLL lobectomy, chronic respiratory failure w/ hypoxia, CHF, CAD s/p stent, ICD, DM 2, ACD.   Pt presents to the clinic today for evaluation of worsening cough. Cough is non-productive but wet in nature. He says worse w/ inspiration. Symptoms associated w/ SOB, decreased exercise tolerance, worseninig sleep. Denies sick contacts. Symptoms started 2 days. He has been taking Mucinex w/ no relief. Denies fever, chills, cp, rhonirrhea, sore throat or any URI symptoms.  Pt is followed by Pulmonology (Dr. Michela Browning) for lung CA. Surveillance chest CT on 03/28/17 was negative for recurrence. Spirometry from May 2019 revealed a FEV1 and FVC moderately reduced and suggest restrictive lung disease secondary to obesity, HF and kidney failure.Pt saw pulmonology 06/20/17 for chronic cough. At the time his cough improved w/ diuresis and they discontinued his bronchodilator because spirometry showed restriction.   Right low back pain: Started two days ago. Dr. Sheppard Browning follows this. Pt diagnosed w/ myofascial release and somatic dysfunction of lumbar spine. Has a schedule with PT for this tomorrow. Denies any injury. No numbness, or tingling in legs. No pain shooting down the leg. Pt says the pain in his right low back is worse when he uses his right leg.    Office Visit from 08/05/2017 in [facility]  PHQ-9 Total Score  16     GAD 7 : Generalized Anxiety Score 08/05/2017 05/14/2017 03/21/2017  Nervous, Anxious, on Edge 0 3 2  Control/stop worrying 2 3 2   Worry too much - different things 2 3 2   Trouble relaxing 0 3 3  Restless 0 0 1  Easily annoyed or irritable 3 3 3   Afraid - awful might happen 2 3 3   Total GAD 7 Score 9 18 16   Anxiety Difficulty Somewhat  difficult - Extremely difficult     Review of Systems  Constitutional: Negative for chills, diaphoresis, fatigue and fever.  HENT: Negative.  Negative for congestion, ear discharge, ear pain, postnasal drip and rhinorrhea.   Eyes: Negative.   Respiratory: Positive for cough (wet cough), shortness of breath and wheezing. Negative for chest tightness.   Cardiovascular: Negative for chest pain and palpitations.  Gastrointestinal: Negative.   Musculoskeletal: Positive for back pain and myalgias (Right paralumbar pain).  Skin: Negative.  Negative for rash.  Neurological: Negative.  Negative for weakness and numbness.  Psychiatric/Behavioral: Positive for dysphoric mood.  All other systems reviewed and are negative.      Objective:   Physical Exam  Constitutional: He appears well-developed and well-nourished. He does not appear ill. No distress.  HENT:  Head: Normocephalic and atraumatic.  Eyes: Pupils are equal, round, and reactive to light. EOM are normal.  Neck: Normal range of motion.  Cardiovascular: Normal rate, regular rhythm and normal heart sounds.  Pulmonary/Chest: He has decreased breath sounds (RLL). He has wheezes (inspiratory and expiratory) in the right upper field and the left lower field. He has rales (diffuse).  Musculoskeletal:       Right lower leg: He exhibits edema (1+ pitting edema).       Left lower leg: He exhibits edema (1+ pitting edema).  Skin: Skin is warm and dry.   Vitals:   08/05/17 1623 08/05/17 1648  BP: 109/66   Pulse: 79   Resp: (!) 85   Temp: 98.1 F (36.7 C)   SpO2:  94%      Assessment & Plan:  Marland KitchenMarland KitchenOsmany was seen today for shortness of breath.  Diagnoses and all orders for this visit:  Acute on chronic combined systolic and diastolic congestive heart failure (HCC)  Restrictive lung disease  Chronic respiratory failure with hypoxia (HCC)  Pulmonary nodules  Chronic combined systolic and diastolic congestive heart failure  (HCC)  Nonproductive cough -     DG Chest 2 View -     doxycycline (VIBRA-TABS) 100 MG tablet; Take 1 tablet (100 mg total) by mouth 2 (two) times daily for 7 days.  - Afebrile, no tachypnea, no tachycardia, SpO2 85% on RA at rest initially, improved to 94% on 2L Lane. Diffuse crackles auscultated on exam with 1+ bilateral lower extremity peripheral edema. Otherwise no significant weight change. Suspect this is more likely CHF exacerbation than pulmonary infection. Will order CXR to rule out infection.  -Pt instructed to increase Torsemide to 1 full tablet tomorrow. Nephrology will be contacted to remove 1-2 L extra fluid as tolerated during dialysis on Wednesday.  Pt will be seen on Thursday with Dr. Darene Browning for follow up on symptoms. Plan discussed w/ pt. Pt voiced understanding.   -Pt depression screen noted to be positive today attributed to chronic health issues. No acute safety issues. Pt lives at home w/ wife and is verbally contracted for safety.

## 2017-08-06 ENCOUNTER — Encounter: Payer: Medicare Other | Admitting: Physical Therapy

## 2017-08-06 ENCOUNTER — Telehealth: Payer: Self-pay | Admitting: Physician Assistant

## 2017-08-06 ENCOUNTER — Encounter: Payer: Self-pay | Admitting: Physician Assistant

## 2017-08-06 DIAGNOSIS — I5043 Acute on chronic combined systolic (congestive) and diastolic (congestive) heart failure: Secondary | ICD-10-CM | POA: Insufficient documentation

## 2017-08-06 MED ORDER — DOXYCYCLINE HYCLATE 100 MG PO TABS: 100.0000 mg | ORAL_TABLET | Freq: Two times a day (BID) | ORAL | 0 refills | Status: AC

## 2017-08-06 NOTE — Progress Notes (Signed)
There is vascular congestion consistent with heart failure like we discussed There are also abnormalities in the lower lobes that we cannot rule out an infection. I sent a prescription for Doxycycline  Keep follow-up with Dr. Darene Lamer

## 2017-08-06 NOTE — Telephone Encounter (Signed)
Spoke to Dr. Neta Ehlers, patient's nephrologist, regarding Mamoru's CHF exacerbation Dr. Neta Ehlers agrees to challenge patient at dialysis tomorrow and will communicate this to the dialysis center now

## 2017-08-08 ENCOUNTER — Encounter: Payer: Self-pay | Admitting: Sports Medicine

## 2017-08-08 ENCOUNTER — Other Ambulatory Visit: Payer: Self-pay | Admitting: Physician Assistant

## 2017-08-08 ENCOUNTER — Ambulatory Visit (INDEPENDENT_AMBULATORY_CARE_PROVIDER_SITE_OTHER): Payer: Medicare Other | Admitting: Sports Medicine

## 2017-08-08 DIAGNOSIS — I25119 Atherosclerotic heart disease of native coronary artery with unspecified angina pectoris: Secondary | ICD-10-CM

## 2017-08-08 DIAGNOSIS — E1122 Type 2 diabetes mellitus with diabetic chronic kidney disease: Secondary | ICD-10-CM

## 2017-08-08 DIAGNOSIS — N186 End stage renal disease: Principal | ICD-10-CM

## 2017-08-08 DIAGNOSIS — I5042 Chronic combined systolic (congestive) and diastolic (congestive) heart failure: Secondary | ICD-10-CM | POA: Diagnosis not present

## 2017-08-08 DIAGNOSIS — Z992 Dependence on renal dialysis: Principal | ICD-10-CM

## 2017-08-08 NOTE — Progress Notes (Signed)
Subjective:    CC: Recheck cough  HPI: Justin Browning is a 72 year old male with systolic heart failure, end-stage renal disease on dialysis, partial lung lobectomy, was seen recently with cough, coarse breath sounds, x-ray showed bilateral basilar atelectasis versus infiltrates.  He was started appropriately on doxycycline and is here for further evaluation and to ensure that he is not worsening.  He tells [name] he feels about the same, he uses his oxygen at home intermittently though it has been suggested he do it consistently.  He did recently have a dialysis session and a proximally 1 to 2 L were dialyzed.  I reviewed the past medical history, family history, social history, surgical history, and allergies today and no changes were needed.  Please see the problem list section below in epic for further details.  Past Medical History: Past Medical History:  Diagnosis Date  . Abdominal aortic aneurysm (AAA) without rupture ([place]) 01/31/2017  . Anemia of chronic disease 10/11/2016  . Aneurysm ([place])    multiple   . Atrial flutter ([place]) 09/23/2014  . CAD (coronary artery disease) 10/11/2016   Overview:  Non STEMI 1/06 Cardiac Catheterization 1/06-Total occlusion of the LAD,OM1,LCX,RPDA CABG 02/15/04(LIMA-LAD,SVG-OM1,SVG-OM2-OM3) Postoperative course complicated by recurrent ventricular and atrial arrhythmias.Acute abdomen requiring exploratory laparotomy, partial resection of the rectosigmoid colon, diverting colostomy s/p revision. AICD 2/06  . Chronic atrial fibrillation ([place]) 10/11/2016  . Chronic congestive heart failure ([place]) 10/11/2016  . Diabetes mellitus without complication ([place])   . Dyspnea on exertion 05/13/2015  . ESRD (end stage renal disease) on dialysis ([place])   . ESRD on dialysis ([place]) 10/11/2016  . Hernia of abdominal cavity   . History of adenocarcinoma of lung 10/11/2016   s/p partial lobectomy right lung  . Hyperlipidemia   . Ischemic cardiomyopathy 10/09/2016  . Lumbar degenerative disc  disease 10/11/2016  . Mixed hyperlipidemia 10/11/2016  . Sleep apnea   . Thrombocytopenia ([place]) 10/11/2016  . Type 2 diabetes mellitus with chronic kidney disease on chronic dialysis, without long-term current use of insulin ([place]) 10/11/2016  . VT (ventricular tachycardia) ([place]) 05/12/2010   Overview:  AICD 03/09/04 [employer]   Past Surgical History: Past Surgical History:  Procedure Laterality Date  . CARDIAC DEFIBRILLATOR PLACEMENT    . CARDIAC SURGERY     Quad Bypass  . COLON SURGERY    . CORONARY ARTERY BYPASS GRAFT    . CORONARY ARTERY BYPASS GRAFT    . [place]  . KNEE SURGERY Bilateral   . LUNG LOBECTOMY Right   . PARTIAL COLECTOMY    . REPLACEMENT TOTAL KNEE Left    Social History: Social History   Socioeconomic History  . Marital status: Married    Spouse name: Not on file  . Number of children: Not on file  . Years of education: Not on file  . Highest education level: Not on file  Occupational History  . Not on file  Social Needs  . Financial resource strain: Not on file  . Food insecurity:    Worry: Not on file    Inability: Not on file  . Transportation needs:    Medical: Not on file    Non-medical: Not on file  Tobacco Use  . Smoking status: Former Smoker    Packs/day: 3.00    Years: 40.00    Pack years: 120.00    Types: Cigarettes    Last attempt to quit: 08/20/2004    Years since quitting: 12.9  . Smokeless tobacco: Never Used  Substance and Sexual Activity  . Alcohol use: No  . Drug use: No  . Sexual activity: Not Currently  Lifestyle  . Physical activity:    Days per week: Not on file    Minutes per session: Not on file  . Stress: Not on file  Relationships  . Social connections:    Talks on phone: Not on file    Gets together: Not on file    Attends religious service: Not on file    Active member of club or organization: Not on file    Attends meetings of clubs or organizations: Not on file    Relationship status: Not on  file  Other Topics Concern  . Not on file  Social History Narrative  . Not on file   Family History: Family History  Problem Relation Age of Onset  . Dementia Mother   . Diabetes Mother   . Stroke Mother   . Cancer Father        colon   Allergies: No Known Allergies Medications: See med rec.  Review of Systems: No fevers, chills, night sweats, weight loss, chest pain, or shortness of breath.   Objective:    General: Well Developed, well nourished, and in no acute distress.  Neuro: Alert and oriented x3, extra-ocular muscles intact, sensation grossly intact.  HEENT: Normocephalic, atraumatic, pupils equal round reactive to light, neck supple, no masses, no lymphadenopathy, thyroid nonpalpable.  Skin: Warm and dry, no rashes. Cardiac: Regular rate and rhythm, no murmurs rubs or gallops, no lower extremity edema.  Respiratory: Coarse sounds throughout.. Not using accessory muscles, speaking in full sentences.  O2 saturation 79% on room air, increased to 93 to 95% on 1 L nasal cannula  Impression and Recommendations:    Chronic congestive heart failure (HCC) Weight is about the same compared to the last visit, he will continue 100 mg of torsemide for now. It looks as though dialysis did draw off an extra liter or 2 of fluid, he dropped about 15 pounds from before and after his dialysis session. I think he needs to continue on 1 L oxygen nasal cannula particularly during this time while his heart is challenged and he has pneumonia, continue doxycycline. There has been no worsening in his condition. No change in plan.  I spent 40 minutes with this patient, greater than 50% was face-to-face time counseling regarding the above diagnoses ___________________________________________ Gwen Her. Dianah Field, M.D., ABFM., CAQSM. Primary Care and [place] Instructor of [employer] [facility]  [facility]

## 2017-08-08 NOTE — Assessment & Plan Note (Signed)
Weight is about the same compared to the last visit, he will continue 100 mg of torsemide for now. It looks as though dialysis did draw off an extra liter or 2 of fluid, he dropped about 15 pounds from before and after his dialysis session. I think he needs to continue on 1 L oxygen nasal cannula particularly during this time while his heart is challenged and he has pneumonia, continue doxycycline. There has been no worsening in his condition. No change in plan.

## 2017-08-12 ENCOUNTER — Telehealth: Payer: Self-pay

## 2017-08-12 DIAGNOSIS — I5042 Chronic combined systolic (congestive) and diastolic (congestive) heart failure: Secondary | ICD-10-CM

## 2017-08-12 DIAGNOSIS — I5043 Acute on chronic combined systolic (congestive) and diastolic (congestive) heart failure: Secondary | ICD-10-CM

## 2017-08-12 NOTE — Telephone Encounter (Signed)
Would be ok with me, routing to PCP to make sure its cool with her.  She da boss on this one.

## 2017-08-12 NOTE — Telephone Encounter (Signed)
Pt wants to know if he can switch back to 50 mg of Torsemide on non-dialysis days. Pt is currently taking 100 mg of Torsemide on non-dialysis days. Pt states he is urinating very frequently in the evenings. Please advise.

## 2017-08-12 NOTE — Telephone Encounter (Signed)
Yes, please return to original dose of 50 mg on non-dialysis days This was a temporary change last week until dialysis could take extra fluid off He should have a repeat CXR this week. I will place the order. He can come in on a non-dialysis day

## 2017-08-17 ENCOUNTER — Other Ambulatory Visit: Payer: Self-pay | Admitting: Physician Assistant

## 2017-08-17 DIAGNOSIS — M5136 Other intervertebral disc degeneration, lumbar region: Secondary | ICD-10-CM

## 2017-08-20 ENCOUNTER — Encounter: Payer: Self-pay | Admitting: Physician Assistant

## 2017-08-20 ENCOUNTER — Ambulatory Visit (INDEPENDENT_AMBULATORY_CARE_PROVIDER_SITE_OTHER): Payer: Medicare Other | Admitting: Physician Assistant

## 2017-08-20 VITALS — BP 120/73 | HR 78 | Wt 259.0 lb

## 2017-08-20 DIAGNOSIS — J984 Other disorders of lung: Secondary | ICD-10-CM | POA: Diagnosis not present

## 2017-08-20 DIAGNOSIS — M7502 Adhesive capsulitis of left shoulder: Secondary | ICD-10-CM | POA: Diagnosis not present

## 2017-08-20 DIAGNOSIS — I25119 Atherosclerotic heart disease of native coronary artery with unspecified angina pectoris: Secondary | ICD-10-CM | POA: Diagnosis not present

## 2017-08-20 DIAGNOSIS — I5042 Chronic combined systolic (congestive) and diastolic (congestive) heart failure: Secondary | ICD-10-CM

## 2017-08-20 DIAGNOSIS — M19012 Primary osteoarthritis, left shoulder: Secondary | ICD-10-CM

## 2017-08-20 DIAGNOSIS — M19011 Primary osteoarthritis, right shoulder: Secondary | ICD-10-CM | POA: Insufficient documentation

## 2017-08-20 NOTE — Patient Instructions (Signed)
Adhesive Capsulitis Adhesive capsulitis is inflammation of the tendons and ligaments that surround the shoulder joint (shoulder capsule). This condition causes the shoulder to become stiff and painful to move. Adhesive capsulitis is also called frozen shoulder. What are the causes? This condition may be caused by:  An injury to the shoulder joint.  Straining the shoulder.  Not moving the shoulder for a period of time. This can happen if your arm was injured or in a sling.  Long-standing health problems, such as: ? Diabetes. ? Thyroid problems. ? Heart disease. ? Stroke. ? Rheumatoid arthritis. ? Lung disease.  In some cases, the cause may not be known. What increases the risk? This condition is more likely to develop in:  Women.  People who are older than 70 years of age.  What are the signs or symptoms? Symptoms of this condition include:  Pain in the shoulder when moving the arm. There may also be pain when parts of the shoulder are touched. The pain is worse at night or when at rest.  Soreness or aching in the shoulder.  Inability to move the shoulder normally.  Muscle spasms.  How is this diagnosed? This condition is diagnosed with a physical exam and imaging tests, such as an X-ray or MRI. How is this treated? This condition may be treated with:  Treatment of the underlying cause or condition.  Physical therapy. This involves performing exercises to get the shoulder moving again.  Medicine. Medicine may be given to relieve pain, inflammation, or muscle spasms.  Steroid injections into the shoulder joint.  Shoulder manipulation. This is a procedure to move the shoulder into another position. It is done after you are given a medicine to make you fall asleep (general anesthetic). The joint may also be injected with salt water at high pressure to break down scarring.  Surgery. This may be done in severe cases when other treatments have failed.  Although most  people recover completely from adhesive capsulitis, some may not regain the full movement of the shoulder. Follow these instructions at home:  Take over-the-counter and prescription medicines only as told by your health care provider.  If you are being treated with physical therapy, follow instructions from your physical therapist.  Avoid exercises that put a lot of demand on your shoulder, such as throwing. These exercises can make pain worse.  If directed, apply ice to the injured area: ? Put ice in a plastic bag. ? Place a towel between your skin and the bag. ? Leave the ice on for 20 minutes, 2-3 times per day. Contact a health care provider if:  You develop new symptoms.  Your symptoms get worse. This information is not intended to replace advice given to you by your health care provider. Make sure you discuss any questions you have with your health care provider. Document Released: 10/29/2008 Document Revised: 06/09/2015 Document Reviewed: 04/26/2014 Elsevier Interactive Patient Education  2018 Elsevier Inc.  

## 2017-08-20 NOTE — Progress Notes (Signed)
HPI:                                                                Justin Browning is a 72 y.o. male who presents to [facility] today for follow-up CHF exacerbation  72 yo M with PMH combined CHF (EF 40-45%), ESRD on dialysis (M,W,F), restrictive lung disease s/p RLL lobectomy on intermittent home O2 presents for 2 week follow-up. Presented to primary care on 08/05/17 with worsening cough and SOB. He was hypoxic at 85% on RA. CXR showed vascular congestion with bilateral lower lobe atelectasis v. Infiltrates. He was diuresed with increased Torsemide and was challenged at dialysis to remove an extra 1-2L. He was also given Doxycycline to cover for lower lobe pneumonia.   Reports cough has nearly resolved. Dyspnea has improved. Using home O2 intermittently when watching tv and at bedtime, 3L.  He also c/o left arm pain x 3 weeks. Pain is worse with flexion and associated with decreased strength and decreased ROM. Denies any known injury or trauma. He is fairly sedentary due to chronic health conditions and has trouble rising from sitting.    Past Medical History:  Diagnosis Date  . Abdominal aortic aneurysm (AAA) without rupture ([place]) 01/31/2017  . Anemia of chronic disease 10/11/2016  . Aneurysm ([place])    multiple   . Atrial flutter ([place]) 09/23/2014  . CAD (coronary artery disease) 10/11/2016   Overview:  Non STEMI 1/06 Cardiac Catheterization 1/06-Total occlusion of the LAD,OM1,LCX,RPDA CABG 02/15/04(LIMA-LAD,SVG-OM1,SVG-OM2-OM3) Postoperative course complicated by recurrent ventricular and atrial arrhythmias.Acute abdomen requiring exploratory laparotomy, partial resection of the rectosigmoid colon, diverting colostomy s/p revision. AICD 2/06  . Chronic atrial fibrillation ([place]) 10/11/2016  . Chronic congestive heart failure ([place]) 10/11/2016  . Diabetes mellitus without complication ([place])   . Dyspnea on exertion 05/13/2015  . ESRD (end stage renal  disease) on dialysis ([place])   . ESRD on dialysis ([place]) 10/11/2016  . Hernia of abdominal cavity   . History of adenocarcinoma of lung 10/11/2016   s/p partial lobectomy right lung  . Hyperlipidemia   . Ischemic cardiomyopathy 10/09/2016  . Lumbar degenerative disc disease 10/11/2016  . Mixed hyperlipidemia 10/11/2016  . Sleep apnea   . Thrombocytopenia ([place]) 10/11/2016  . Type 2 diabetes mellitus with chronic kidney disease on chronic dialysis, without long-term current use of insulin ([place]) 10/11/2016  . VT (ventricular tachycardia) ([place]) 05/12/2010   Overview:  AICD 03/09/04 [employer]   Past Surgical History:  Procedure Laterality Date  . CARDIAC DEFIBRILLATOR PLACEMENT    . CARDIAC SURGERY     Quad Bypass  . COLON SURGERY    . CORONARY ARTERY BYPASS GRAFT    . CORONARY ARTERY BYPASS GRAFT    . [place]  . KNEE SURGERY Bilateral   . LUNG LOBECTOMY Right   . PARTIAL COLECTOMY    . REPLACEMENT TOTAL KNEE Left    Social History   Tobacco Use  . Smoking status: Former Smoker    Packs/day: 3.00    Years: 40.00    Pack years: 120.00    Types: Cigarettes    Last attempt to quit: 08/20/2004    Years since quitting: 13.0  . Smokeless tobacco: Never Used  Substance  Use Topics  . Alcohol use: No   family history includes Cancer in his father; Dementia in his mother; Diabetes in his mother; Stroke in his mother.    ROS: negative except as noted in the HPI  Medications: Current Outpatient Medications  Medication Sig Dispense Refill  . amiodarone (PACERONE) 200 MG tablet Take 200 mg by mouth daily.    [name] apixaban (ELIQUIS) 5 MG TABS tablet Take 1 tablet (5 mg total) by mouth 2 (two) times daily. 60 tablet 5  . aspirin 81 MG chewable tablet Chew by mouth daily.    [name] atorvastatin (LIPITOR) 20 MG tablet Take 1 tablet (20 mg total) by mouth at bedtime. 90 tablet 0  . carvedilol (COREG) 6.25 MG tablet Take 3.125 mg by mouth 2 (two) times daily with a meal.     .  Cholecalciferol (VITAMIN D3) 1000 units CAPS Vitamin D3 1,000 unit capsule  Take by oral route.    . gabapentin (NEURONTIN) 300 MG capsule TAKE 1 CAPSULE BY MOUTH DAILY 90 capsule 0  . MULTIPLE VITAMIN PO Take by mouth.    . NONFORMULARY OR COMPOUNDED ITEM Home oxygen 3L per minute on nasal cannula 1 each 0  . OXYGEN Inhale 2 L into the lungs as needed.    . pantoprazole (PROTONIX) 20 MG tablet Take 2 tablets (40 mg total) by mouth daily. 90 tablet 1  . sevelamer carbonate (RENVELA) 800 MG tablet Take 800 mg by mouth 3 (three) times daily with meals.    . torsemide (DEMADEX) 100 MG tablet Take 50 mg by mouth. Take 1/2 tab PO on nondialysis days    . TRADJENTA 5 MG TABS tablet TAKE 1 TABLET(5 MG) BY MOUTH DAILY 90 tablet 0   No current facility-administered medications for this visit.    No Known Allergies     Objective:  BP 120/73   Pulse 78   Wt 259 lb (117.5 kg)   SpO2 92%   BMI 33.25 kg/m  Gen:  alert, not ill-appearing, no distress, appropriate for age, ambulating without assistance today HEENT: head normocephalic without obvious abnormality, conjunctiva and cornea clear, trachea midline Pulm: Normal work of breathing, normal phonation, clear to auscultation bilaterally, no wheezes, rales or rhonchi CV: Normal rate, regular rhythm, s1 and s2 distinct, no murmurs, clicks or rubs  Neuro: alert and oriented x 3, no tremor MSK: extremities atraumatic, antalgic gait Left shoulder: atraumatic, tenderness overlying a/c joint, loss of passive flexion, only able to flex to 95 degrees, negative Hawkin's Skin: intact, no rashes on exposed skin, no jaundice, no cyanosis Psych: well-groomed, cooperative, good eye contact, affect full-range, speech is articulate, and thought processes clear and goal-directed    No results found for this or any previous visit (from the past 72 hour(s)). No results found.    Assessment and Plan: 72 y.o. male with   Chronic combined systolic and  diastolic congestive heart failure ([place]) - Plan: B Nat Peptide  Restrictive lung disease  Adhesive capsulitis of left shoulder - Plan: Ambulatory referral to Physical Therapy  Chronic combined CHF SpO2 92% on RA at rest, patient is well-appearing, lungs are clear to ausculation Encouraged to continue home oxygen to maintain SpO2 above 88% Return to Torsemide 50 mg on non-dialysis days Unfortunately EF is above 40%, so he is not a candidate for Entresto Cont Carvedilol and Amiodarone Keep follow-up with Cardiology  Left shoulder pain - loss of passive ROM c/f adhesive capsulitis - avoiding NSAIDs due to ESRD and Eliquis -  referred to formal PT - follow-up with Sports Medicine for possible corticosteroid injection  Patient education and anticipatory guidance given Patient agrees with treatment plan Follow-up in 3 months for med mgmt or sooner as needed if symptoms worsen or fail to improve  Darlyne Russian PA-C

## 2017-08-21 ENCOUNTER — Encounter: Payer: Self-pay | Admitting: Physician Assistant

## 2017-08-21 LAB — BRAIN NATRIURETIC PEPTIDE: BRAIN NATRIURETIC PEPTIDE: 148 pg/mL — AB (ref <100)

## 2017-08-29 ENCOUNTER — Ambulatory Visit (INDEPENDENT_AMBULATORY_CARE_PROVIDER_SITE_OTHER): Payer: Medicare Other | Admitting: Rehabilitative and Restorative Service Providers"

## 2017-08-29 ENCOUNTER — Encounter: Payer: Self-pay | Admitting: Rehabilitative and Restorative Service Providers"

## 2017-08-29 DIAGNOSIS — R293 Abnormal posture: Secondary | ICD-10-CM

## 2017-08-29 DIAGNOSIS — M25512 Pain in left shoulder: Secondary | ICD-10-CM | POA: Diagnosis present

## 2017-08-29 DIAGNOSIS — R29898 Other symptoms and signs involving the musculoskeletal system: Secondary | ICD-10-CM

## 2017-08-29 NOTE — Patient Instructions (Signed)
Over the door pulley - can google or check with company below  Should be < $20   [facility] [phone]  [place] [phone]   Shoulder Blade Squeeze    Rotate shoulders back, then squeeze shoulder blades down and back . Repeat __10__ times. Do __2-3__ sessions per day.   Upper Back Strength: Lower Trapezius / Rotator Cuff " L's "     Arms in waitress pose, palms up. Press hands back and slide shoulder blades down. Hold for __5__ seconds. Repeat _10___ times. 1-2 times per day.    Low Row: can do sitting     Face anchor, feet shoulder width apart. Palms up, pull arms back, squeezing shoulder blades together. Repeat _10_ times per set. Do 2-3__ sets per session. Do _1_ sessions per day  Anchor Height: Waist

## 2017-08-29 NOTE — Therapy (Addendum)
[place], [place], [address] Phone: [phone]   Fax:  [phone]  Physical Therapy Treatment  Patient Details  Name: Justin Browning MRN: [id number] Date of Birth: [birth year] Referring Provider: Nelson Chimes, PA-C   Encounter Date: 08/29/2017  PT End of Session - 08/29/17 1007    Visit Number  1    Number of Visits  6    Date for PT Re-Evaluation  10/10/17    PT Start Time  0930    PT Stop Time  1012    PT Time Calculation (min)  42 min    Activity Tolerance  Patient tolerated treatment well       Past Medical History:  Diagnosis Date  . Abdominal aortic aneurysm (AAA) without rupture ([place]) 01/31/2017  . Anemia of chronic disease 10/11/2016  . Aneurysm ([place])    multiple   . Atrial flutter ([place]) 09/23/2014  . CAD (coronary artery disease) 10/11/2016   Overview:  Non STEMI 1/06 Cardiac Catheterization 1/06-Total occlusion of the LAD,OM1,LCX,RPDA CABG 02/15/04(LIMA-LAD,SVG-OM1,SVG-OM2-OM3) Postoperative course complicated by recurrent ventricular and atrial arrhythmias.Acute abdomen requiring exploratory laparotomy, partial resection of the rectosigmoid colon, diverting colostomy s/p revision. AICD 2/06  . Chronic atrial fibrillation ([place]) 10/11/2016  . Chronic congestive heart failure ([place]) 10/11/2016  . Diabetes mellitus without complication ([place])   . Dyspnea on exertion 05/13/2015  . ESRD (end stage renal disease) on dialysis ([place])   . ESRD on dialysis ([place]) 10/11/2016  . Hernia of abdominal cavity   . History of adenocarcinoma of lung 10/11/2016   s/p partial lobectomy right lung  . Hyperlipidemia   . Ischemic cardiomyopathy 10/09/2016  . Lumbar degenerative disc disease 10/11/2016  . Mixed hyperlipidemia 10/11/2016  . Sleep apnea   . Thrombocytopenia ([place]) 10/11/2016  . Type 2 diabetes mellitus with chronic kidney disease on chronic dialysis, without long-term current use of insulin ([place])  10/11/2016  . VT (ventricular tachycardia) ([place]) 05/12/2010   Overview:  AICD 03/09/04 [employer]    Past Surgical History:  Procedure Laterality Date  . CARDIAC DEFIBRILLATOR PLACEMENT    . CARDIAC SURGERY     Quad Bypass  . COLON SURGERY    . CORONARY ARTERY BYPASS GRAFT    . CORONARY ARTERY BYPASS GRAFT    . [place]  . KNEE SURGERY Bilateral   . LUNG LOBECTOMY Right   . PARTIAL COLECTOMY    . REPLACEMENT TOTAL KNEE Left     There were no vitals filed for this visit.  Subjective Assessment - 08/29/17 0935    Subjective  [name] reports that he has difficulty with any exercises. He has pain somewhere else when trying to exercise a different part of his body. He thinks that he has developed Lt shoulder pain about 1-2 months ago. He feels the pain is due to pushing with his arms to get up from bed or chair.     Pertinent History  dialysis M/W/F, h/o lung CA 2017 with lobectomy, bilat TKA, Rt hip ORIF in the 60's, defibrillator for heart - not a pacemaker per pt.     Diagnostic tests  x-ray    Patient Stated Goals  learn a few easy exercises for his shoulder     Currently in Pain?  Yes    Pain Score  5     Pain Location  Shoulder    Pain Orientation  Left    Pain Descriptors / Indicators  Throbbing;Aching   lifting  arm to side or front    Pain Type  Acute pain;Chronic pain    Pain Onset  More than a month ago    Pain Frequency  Intermittent    Aggravating Factors   lifting; reaching up; reaching out; throbbing at night     Pain Relieving Factors  relaxing shoulder          [facility] PT Assessment - 08/29/17 0001      Assessment   Medical Diagnosis  Adhesive capsulitis Lt shoulder     Referring Provider  Justin Chimes, PA-C    Onset Date/Surgical Date  06/29/17    Hand Dominance  Right    Next MD Visit  PRN    Prior Therapy  here for back       Precautions   Precaution Comments  dialysis port Rt forearm       Balance Screen   Has the patient fallen in  the past 6 months  Yes    How many times?  3    Has the patient had a decrease in activity level because of a fear of falling?   No    Is the patient reluctant to leave their home because of a fear of falling?   No      Prior Function   Level of Independence  Independent    Vocation  Retired    Leisure  sedentary life style      Cognition   Overall Cognitive Status  --      Observation/Other Assessments   Focus on Therapeutic Outcomes (FOTO)   44% limitation       Sensation   Additional Comments  numbness in bilat hands/fingers       Posture/Postural Control   Posture Comments  head forward; shoulders rounded and elevated; increased thoracic kyphosis; head of the humerus anterior in orientation       AROM   Right Shoulder Flexion  101 Degrees    Right Shoulder ABduction  87 Degrees    Right Shoulder External Rotation  51 Degrees   elbow at side - neutral    Left Shoulder Flexion  115 Degrees    Left Shoulder ABduction  97 Degrees   with pain    Left Shoulder External Rotation  50 Degrees   elbow at side - neutral      Strength   Overall Strength Comments  Lt shoulder flex; abd; ER 4+/5 tested in neutral positions       Palpation   Palpation comment  palpable tightness through the anterior shoulder, upper trap, leveator                    [facility] Adult PT Treatment/Exercise - 08/29/17 0001      Shoulder Exercises: Seated   Row  Strengthening;Right;Left;10 reps;Theraband    Theraband Level (Shoulder Row)  Level 2 (Red)    Other Seated Exercises  scap squeeze 10 sec x 10    Other Seated Exercises  L's x 10       Shoulder Exercises: Pulleys   Flexion  2 minutes   5-10 sec hold at end of comfortable range             PT Education - 08/29/17 1006    Education Details  HEP    Person(s) Educated  Patient    Methods  Explanation;Demonstration;Tactile cues;Verbal cues;Handout    Comprehension  Verbalized understanding;Returned demonstration;Verbal cues  required;Tactile cues required  PT Long Term Goals - 08/29/17 1149      PT LONG TERM GOAL #1   Title  Instruct patient in initial HEP for Lt shoulder 10/10/17    Time  6    Period  Weeks    Status  New      PT LONG TERM GOAL #2   Title  Decrease pain Lt shoulder by 50-70% with elevation of shoulder to reach a shelf at shoulder height 10/10/17     Time  6    Period  Weeks    Status  New      PT LONG TERM GOAL #3   Title  Independent in HEP 10/10/17    Time  6    Period  Weeks    Status  New      PT LONG TERM GOAL #4   Title  Improve FOTO to </= 34% limitation 10/10/17    Time  6    Period  Weeks    Status  New      PT LONG TERM GOAL #5   Title  -            Plan - 08/29/17 1014    Clinical Impression Statement  Saim presents with 1-2 month history of Lt shoulder pain with resolving symptoms. He has some persistent pain with reaching or lifting with Lt UE; limited ROM; muscular tightness posterior shoulder girdle; limited functional activity tolerance with Lt UE. he will benefit form PT to address problems identified.     Clinical Presentation due to:  Patient states that most exercises make something worse, He wants some easy exercises for his shoulder.     Clinical Decision Making  Low    Rehab Potential  Fair    PT Frequency  1x / week    PT Duration  6 weeks    PT Treatment/Interventions  Gait training;Neuromuscular re-education;Manual techniques;Functional mobility training;Moist Heat;Ultrasound;Therapeutic activities;Patient/family education;Therapeutic exercise;Cryotherapy    PT Next Visit Plan  Progress with shoudler exercises as indicated     Consulted and Agree with Plan of Care  Patient       Patient will benefit from skilled therapeutic intervention in order to improve the following deficits and impairments:  Abnormal gait, Pain, Improper body mechanics, Postural dysfunction, Decreased coordination, Decreased activity tolerance, Decreased range  of motion, Decreased strength, Obesity, Difficulty walking, Decreased safety awareness, Decreased balance  Visit Diagnosis: Acute pain of left shoulder - Plan: PT plan of care cert/re-cert  Other symptoms and signs involving the musculoskeletal system - Plan: PT plan of care cert/re-cert  Abnormal posture - Plan: PT plan of care cert/re-cert     Problem List Patient Active Problem List   Diagnosis Date Noted  . Adhesive capsulitis of left shoulder 08/20/2017  . Acute on chronic combined systolic and diastolic congestive heart failure ([place]) 08/06/2017  . Chronic respiratory failure with hypoxia ([place]) 08/05/2017  . Pulmonary nodules 08/05/2017  . Pleural effusion on right 05/16/2017  . Restrictive lung disease 05/14/2017  . History of pneumonia 03/29/2017  . Chronic cough 03/29/2017  . Iliac artery aneurysm, bilateral ([place]) 02/24/2017  . Osteoarthritis of multiple joints 02/08/2017  . Requires continuous at home supplemental oxygen 02/08/2017  . Encounter for long-term (current) use of high-risk medication 01/31/2017  . Abdominal aortic aneurysm (AAA) without rupture ([place]) 01/31/2017  . History of adenocarcinoma of lung 10/11/2016  . ESRD on dialysis ([place]) 10/11/2016  . Paroxysmal A-fib ([place]) 10/11/2016  . Type 2 diabetes mellitus with  chronic kidney disease on chronic dialysis, without long-term current use of insulin ([place]) 10/11/2016  . Mixed hyperlipidemia 10/11/2016  . Artificial cardiac pacemaker 10/11/2016  . Coronary artery disease involving native coronary artery of native heart without angina pectoris 10/11/2016  . Anemia of chronic disease 10/11/2016  . Thrombocytopenia ([place]) 10/11/2016  . AICD (automatic cardioverter/defibrillator) present 10/11/2016  . Chronic congestive heart failure ([place]) 10/11/2016  . Hx of CABG 10/11/2016  . Non-gaseous abdominal distention 10/11/2016  . Enlarged prostate 10/11/2016  . Lumbar degenerative disc disease 10/11/2016  . Ischemic  cardiomyopathy 10/09/2016  . Chest pain 06/19/2016  . Dyspnea on exertion 05/13/2015  . Long term current use of anticoagulants with INR goal of 2.0-3.0 05/13/2015  . CKD (chronic kidney disease) stage 5, GFR less than 15 ml/min (HCC) 01/13/2014    Celyn Nilda Simmer PT, MPH  08/29/2017, 12:49 PM  [facility] [place], [place], [address] Phone: [phone]   Fax:  [phone]  Name: Justin Browning MRN: [id number] Date of Birth: 1945/09/28  PHYSICAL THERAPY DISCHARGE SUMMARY  Visits from Start of Care: Evaluation only   Current functional level related to goals / functional outcomes: See Initial Evaluation    Remaining deficits: Unknown    Education / Equipment: Initial HEP  Plan: Patient agrees to discharge.  Patient goals were not met. Patient is being discharged due to not returning since the last visit.  ?????     Celyn P. Helene Kelp PT, MPH 09/26/17 4:29 PM

## 2017-09-03 ENCOUNTER — Encounter: Payer: Medicare Other | Admitting: Rehabilitative and Restorative Service Providers"

## 2017-09-05 ENCOUNTER — Encounter: Payer: Medicare Other | Admitting: Rehabilitative and Restorative Service Providers"

## 2017-09-07 ENCOUNTER — Other Ambulatory Visit: Payer: Self-pay | Admitting: Physician Assistant

## 2017-09-07 DIAGNOSIS — I25119 Atherosclerotic heart disease of native coronary artery with unspecified angina pectoris: Secondary | ICD-10-CM

## 2017-09-07 DIAGNOSIS — E782 Mixed hyperlipidemia: Secondary | ICD-10-CM

## 2017-09-09 ENCOUNTER — Other Ambulatory Visit: Payer: Self-pay | Admitting: Physician Assistant

## 2017-09-18 ENCOUNTER — Other Ambulatory Visit: Payer: Self-pay | Admitting: Physician Assistant

## 2017-09-18 DIAGNOSIS — Z7901 Long term (current) use of anticoagulants: Secondary | ICD-10-CM

## 2017-09-18 DIAGNOSIS — I480 Paroxysmal atrial fibrillation: Secondary | ICD-10-CM

## 2017-09-19 ENCOUNTER — Ambulatory Visit: Payer: Medicare Other | Admitting: Podiatry

## 2017-09-26 ENCOUNTER — Ambulatory Visit (INDEPENDENT_AMBULATORY_CARE_PROVIDER_SITE_OTHER): Payer: Medicare Other | Admitting: Sports Medicine

## 2017-09-26 ENCOUNTER — Encounter: Payer: Self-pay | Admitting: Sports Medicine

## 2017-09-26 DIAGNOSIS — I25119 Atherosclerotic heart disease of native coronary artery with unspecified angina pectoris: Secondary | ICD-10-CM

## 2017-09-26 DIAGNOSIS — M5136 Other intervertebral disc degeneration, lumbar region: Secondary | ICD-10-CM

## 2017-09-26 DIAGNOSIS — M7502 Adhesive capsulitis of left shoulder: Secondary | ICD-10-CM

## 2017-09-26 DIAGNOSIS — M51369 Other intervertebral disc degeneration, lumbar region without mention of lumbar back pain or lower extremity pain: Secondary | ICD-10-CM

## 2017-09-26 NOTE — Progress Notes (Signed)
Subjective:    CC: Follow-up  HPI: Justin Browning returns, we have been treating him for adhesive capsulitis, shoulder arthritis and lumbar spinal stenosis.  He says he feels okay.  No intervention needed.  I reviewed the past medical history, family history, social history, surgical history, and allergies today and no changes were needed.  Please see the problem list section below in epic for further details.  Past Medical History: Past Medical History:  Diagnosis Date  . Abdominal aortic aneurysm (AAA) without rupture ([place]) 01/31/2017  . Anemia of chronic disease 10/11/2016  . Aneurysm ([place])    multiple   . Atrial flutter ([place]) 09/23/2014  . CAD (coronary artery disease) 10/11/2016   Overview:  Non STEMI 1/06 Cardiac Catheterization 1/06-Total occlusion of the LAD,OM1,LCX,RPDA CABG 02/15/04(LIMA-LAD,SVG-OM1,SVG-OM2-OM3) Postoperative course complicated by recurrent ventricular and atrial arrhythmias.Acute abdomen requiring exploratory laparotomy, partial resection of the rectosigmoid colon, diverting colostomy s/p revision. AICD 2/06  . Chronic atrial fibrillation ([place]) 10/11/2016  . Chronic congestive heart failure ([place]) 10/11/2016  . Diabetes mellitus without complication ([place])   . Dyspnea on exertion 05/13/2015  . ESRD (end stage renal disease) on dialysis ([place])   . ESRD on dialysis ([place]) 10/11/2016  . Hernia of abdominal cavity   . History of adenocarcinoma of lung 10/11/2016   s/p partial lobectomy right lung  . Hyperlipidemia   . Ischemic cardiomyopathy 10/09/2016  . Lumbar degenerative disc disease 10/11/2016  . Mixed hyperlipidemia 10/11/2016  . Sleep apnea   . Thrombocytopenia ([place]) 10/11/2016  . Type 2 diabetes mellitus with chronic kidney disease on chronic dialysis, without long-term current use of insulin ([place]) 10/11/2016  . VT (ventricular tachycardia) ([place]) 05/12/2010   Overview:  AICD 03/09/04 [employer]   Past Surgical History: Past Surgical History:  Procedure Laterality  Date  . CARDIAC DEFIBRILLATOR PLACEMENT    . CARDIAC SURGERY     Quad Bypass  . COLON SURGERY    . CORONARY ARTERY BYPASS GRAFT    . CORONARY ARTERY BYPASS GRAFT    . [place]  . KNEE SURGERY Bilateral   . LUNG LOBECTOMY Right   . PARTIAL COLECTOMY    . REPLACEMENT TOTAL KNEE Left    Social History: Social History   Socioeconomic History  . Marital status: Married    Spouse name: Not on file  . Number of children: Not on file  . Years of education: Not on file  . Highest education level: Not on file  Occupational History  . Not on file  Social Needs  . Financial resource strain: Not on file  . Food insecurity:    Worry: Not on file    Inability: Not on file  . Transportation needs:    Medical: Not on file    Non-medical: Not on file  Tobacco Use  . Smoking status: Former Smoker    Packs/day: 3.00    Years: 40.00    Pack years: 120.00    Types: Cigarettes    Last attempt to quit: 08/20/2004    Years since quitting: 13.1  . Smokeless tobacco: Never Used  Substance and Sexual Activity  . Alcohol use: No  . Drug use: No  . Sexual activity: Not Currently  Lifestyle  . Physical activity:    Days per week: Not on file    Minutes per session: Not on file  . Stress: Not on file  Relationships  . Social connections:    Talks on phone: Not on file    Gets  together: Not on file    Attends religious service: Not on file    Active member of club or organization: Not on file    Attends meetings of clubs or organizations: Not on file    Relationship status: Not on file  Other Topics Concern  . Not on file  Social History Narrative  . Not on file   Family History: Family History  Problem Relation Age of Onset  . Dementia Mother   . Diabetes Mother   . Stroke Mother   . Cancer Father        colon   Allergies: No Known Allergies Medications: See med rec.  Review of Systems: No fevers, chills, night sweats, weight loss, chest pain, or shortness of  breath.   Objective:    General: Well Developed, well nourished, and in no acute distress.  Neuro: Alert and oriented x3, extra-ocular muscles intact, sensation grossly intact.  HEENT: Normocephalic, atraumatic, pupils equal round reactive to light, neck supple, no masses, no lymphadenopathy, thyroid nonpalpable.  Skin: Warm and dry, no rashes. Cardiac: Regular rate and rhythm, no murmurs rubs or gallops, no lower extremity edema.  Respiratory: Clear to auscultation bilaterally. Not using accessory muscles, speaking in full sentences.  Impression and Recommendations:    Adhesive capsulitis of left shoulder Doing okay, no change in plan needed.  Lumbar degenerative disc disease Highly profound spinal stenosis at L4-L5 which is the cause of his intermittent incontinence, leg weakness. He is continually declined any form of invasive intervention. He did get a bit of relief with osteopathic manipulation, chiropractic manipulation. He is done with aquatic therapy, has been using the stationary bike more in the gym and overall feels okay. No further intervention from my perspective. ___________________________________________ Gwen Her. Dianah Field, M.D., ABFM., CAQSM. Primary Care and [place] Instructor of [employer] [facility] [employer]

## 2017-09-26 NOTE — Assessment & Plan Note (Signed)
Doing okay, no change in plan needed.

## 2017-09-26 NOTE — Assessment & Plan Note (Signed)
Highly profound spinal stenosis at L4-L5 which is the cause of his intermittent incontinence, leg weakness. He is continually declined any form of invasive intervention. He did get a bit of relief with osteopathic manipulation, chiropractic manipulation. He is done with aquatic therapy, has been using the stationary bike more in the gym and overall feels okay. No further intervention from my perspective.

## 2017-10-01 ENCOUNTER — Encounter: Payer: Self-pay | Admitting: Podiatry

## 2017-10-01 ENCOUNTER — Ambulatory Visit (INDEPENDENT_AMBULATORY_CARE_PROVIDER_SITE_OTHER): Payer: Medicare Other | Admitting: Podiatry

## 2017-10-01 DIAGNOSIS — B351 Tinea unguium: Secondary | ICD-10-CM

## 2017-10-01 DIAGNOSIS — M79672 Pain in left foot: Secondary | ICD-10-CM | POA: Diagnosis not present

## 2017-10-01 DIAGNOSIS — M79671 Pain in right foot: Secondary | ICD-10-CM

## 2017-10-01 LAB — HM DIABETES FOOT EXAM: HM Diabetic Foot Exam: NORMAL

## 2017-10-01 NOTE — Patient Instructions (Signed)
Seen for hypertrophic nails. All nails debrided. Return in 3 months or as needed.  

## 2017-10-01 NOTE — Progress Notes (Signed)
Subjective: 72 y.o. year old male patient presents complaining of painful nails. Patient requests toe nails trimmed.  Blood sugar was at 150 this morning.  Objective: Dermatologic: Thick yellow deformed nails x 10. Vascular: Pedal pulses are all palpable. Orthopedic: Contracted lesser digits bilateral. Neurologic: All epicritic and tactile sensations grossly intact.  Assessment: Dystrophic mycotic nails x 10. Painful nails.  Treatment: All mycotic nails debrided.  Return in 3 months or as needed.

## 2017-10-25 ENCOUNTER — Other Ambulatory Visit: Payer: Self-pay | Admitting: Physician Assistant

## 2017-11-15 ENCOUNTER — Other Ambulatory Visit: Payer: Self-pay | Admitting: Physician Assistant

## 2017-11-15 DIAGNOSIS — M5136 Other intervertebral disc degeneration, lumbar region: Secondary | ICD-10-CM

## 2017-11-17 ENCOUNTER — Other Ambulatory Visit: Payer: Self-pay | Admitting: Physician Assistant

## 2017-11-17 DIAGNOSIS — I480 Paroxysmal atrial fibrillation: Secondary | ICD-10-CM

## 2017-11-17 DIAGNOSIS — Z7901 Long term (current) use of anticoagulants: Secondary | ICD-10-CM

## 2017-12-15 ENCOUNTER — Other Ambulatory Visit: Payer: Self-pay | Admitting: Physician Assistant

## 2017-12-15 DIAGNOSIS — I480 Paroxysmal atrial fibrillation: Secondary | ICD-10-CM

## 2017-12-15 DIAGNOSIS — Z7901 Long term (current) use of anticoagulants: Secondary | ICD-10-CM

## 2017-12-25 ENCOUNTER — Ambulatory Visit (INDEPENDENT_AMBULATORY_CARE_PROVIDER_SITE_OTHER): Payer: Medicare Other | Admitting: Physician Assistant

## 2017-12-25 ENCOUNTER — Other Ambulatory Visit: Payer: Self-pay | Admitting: Physician Assistant

## 2017-12-25 ENCOUNTER — Encounter: Payer: Self-pay | Admitting: Physician Assistant

## 2017-12-25 VITALS — BP 91/52 | HR 71 | Temp 97.8°F | Resp 14

## 2017-12-25 DIAGNOSIS — Z992 Dependence on renal dialysis: Principal | ICD-10-CM

## 2017-12-25 DIAGNOSIS — I25119 Atherosclerotic heart disease of native coronary artery with unspecified angina pectoris: Secondary | ICD-10-CM | POA: Diagnosis not present

## 2017-12-25 DIAGNOSIS — J9611 Chronic respiratory failure with hypoxia: Secondary | ICD-10-CM | POA: Diagnosis not present

## 2017-12-25 DIAGNOSIS — E1122 Type 2 diabetes mellitus with diabetic chronic kidney disease: Secondary | ICD-10-CM

## 2017-12-25 DIAGNOSIS — N186 End stage renal disease: Principal | ICD-10-CM

## 2017-12-25 DIAGNOSIS — J22 Unspecified acute lower respiratory infection: Secondary | ICD-10-CM

## 2017-12-25 MED ORDER — DOXYCYCLINE HYCLATE 100 MG PO TABS: 100.0000 mg | ORAL_TABLET | Freq: Two times a day (BID) | ORAL | 0 refills | Status: AC

## 2017-12-25 MED ORDER — GUAIFENESIN-CODEINE 100-10 MG/5ML PO SOLN: 2.5000 mL | Freq: Four times a day (QID) | ORAL | 0 refills | Status: AC | PRN

## 2017-12-25 NOTE — Progress Notes (Signed)
HPI:                                                                Justin Browning is a 72 y.o. male who presents to [facility]: [place] today for non-productive cough  Cough  This is a new problem. The current episode started in the past 7 days (x 2-3 days). The problem has been unchanged. The problem occurs every few minutes. The cough is non-productive. Pertinent negatives include no chest pain, ear pain, fever, hemoptysis, nasal congestion, shortness of breath or wheezing. Nothing aggravates the symptoms. Risk factors for lung disease include travel. He has tried nothing for the symptoms. His past medical history is significant for pneumonia. + chronic respiratory failure    Refused his weight today. He is just coming from dialysis He is not wearing his oxygen today. Reports he does not feel any different when he wears his oxygen. He wears it intermittently at home.   Past Medical History:  Diagnosis Date  . Abdominal aortic aneurysm (AAA) without rupture ([place]) 01/31/2017  . Anemia of chronic disease 10/11/2016  . Aneurysm ([place])    multiple   . Atrial flutter ([place]) 09/23/2014  . CAD (coronary artery disease) 10/11/2016   Overview:  Non STEMI 1/06 Cardiac Catheterization 1/06-Total occlusion of the LAD,OM1,LCX,RPDA CABG 02/15/04(LIMA-LAD,SVG-OM1,SVG-OM2-OM3) Postoperative course complicated by recurrent ventricular and atrial arrhythmias.Acute abdomen requiring exploratory laparotomy, partial resection of the rectosigmoid colon, diverting colostomy s/p revision. AICD 2/06  . Chronic atrial fibrillation 10/11/2016  . Chronic congestive heart failure ([place]) 10/11/2016  . Diabetes mellitus without complication ([place])   . Dyspnea on exertion 05/13/2015  . ESRD (end stage renal disease) on dialysis ([place])   . ESRD on dialysis ([place]) 10/11/2016  . Hernia of abdominal cavity   . History of adenocarcinoma of lung 10/11/2016   s/p partial lobectomy right lung  .  Hyperlipidemia   . Ischemic cardiomyopathy 10/09/2016  . Lumbar degenerative disc disease 10/11/2016  . Mixed hyperlipidemia 10/11/2016  . Sleep apnea   . Thrombocytopenia ([place]) 10/11/2016  . Type 2 diabetes mellitus with chronic kidney disease on chronic dialysis, without long-term current use of insulin ([place]) 10/11/2016  . VT (ventricular tachycardia) ([place]) 05/12/2010   Overview:  AICD 03/09/04 [employer]   Past Surgical History:  Procedure Laterality Date  . CARDIAC DEFIBRILLATOR PLACEMENT    . CARDIAC SURGERY     Quad Bypass  . COLON SURGERY    . CORONARY ARTERY BYPASS GRAFT    . CORONARY ARTERY BYPASS GRAFT    . [place]  . KNEE SURGERY Bilateral   . LUNG LOBECTOMY Right   . PARTIAL COLECTOMY    . REPLACEMENT TOTAL KNEE Left    Social History   Tobacco Use  . Smoking status: Former Smoker    Packs/day: 3.00    Years: 40.00    Pack years: 120.00    Types: Cigarettes    Last attempt to quit: 08/20/2004    Years since quitting: 13.3  . Smokeless tobacco: Never Used  Substance Use Topics  . Alcohol use: No   family history includes Cancer in his father; Dementia in his mother; Diabetes in his mother; Stroke in his mother.    ROS: negative  except as noted in the HPI  Medications: Current Outpatient Medications  Medication Sig Dispense Refill  . amiodarone (PACERONE) 200 MG tablet Take 200 mg by mouth daily.    [name] aspirin 81 MG chewable tablet Chew by mouth daily.    [name] atorvastatin (LIPITOR) 20 MG tablet Take 1 tablet (20 mg total) by mouth daily at 6 PM. Due for lab 30 tablet 0  . carvedilol (COREG) 6.25 MG tablet Take 3.125 mg by mouth 2 (two) times daily with a meal.     . Cholecalciferol (VITAMIN D3) 1000 units CAPS Vitamin D3 1,000 unit capsule  Take by oral route.    . doxycycline (VIBRA-TABS) 100 MG tablet Take 1 tablet (100 mg total) by mouth 2 (two) times daily for 7 days. 14 tablet 0  . ELIQUIS 5 MG TABS tablet TAKE 1 TABLET(5 MG) BY MOUTH TWICE  DAILY 60 tablet 0  . gabapentin (NEURONTIN) 300 MG capsule TAKE 1 CAPSULE BY MOUTH DAILY 90 capsule 0  . guaiFENesin-codeine 100-10 MG/5ML syrup Take 2.5 mLs by mouth every 6 (six) hours as needed for up to 5 days for cough. 50 mL 0  . linagliptin (TRADJENTA) 5 MG TABS tablet Take 1 tablet (5 mg total) by mouth daily. PLEASE MAKE APPOINTMENT 90 tablet 0  . MULTIPLE VITAMIN PO Take by mouth.    . NONFORMULARY OR COMPOUNDED ITEM Home oxygen 3L per minute on nasal cannula 1 each 0  . OXYGEN Inhale 2 L into the lungs as needed.    . pantoprazole (PROTONIX) 20 MG tablet TAKE 2 TABLETS(40 MG) BY MOUTH DAILY 180 tablet 0  . sevelamer carbonate (RENVELA) 800 MG tablet Take 800 mg by mouth 3 (three) times daily with meals.    . torsemide (DEMADEX) 100 MG tablet Take 50 mg by mouth. Take 1/2 tab PO on nondialysis days     No current facility-administered medications for this visit.    No Known Allergies     Objective:  BP (!) 91/52   Pulse 71   Temp 97.8 F (36.6 C) (Oral)   Resp 14   SpO2 91%  Gen:  alert, not ill-appearing, no distress, appropriate for age 72: head normocephalic without obvious abnormality, conjunctiva and cornea clear, trachea midline Pulm: Normal work of breathing, normal phonation, clear to auscultation bilaterally, no wheezes, rales or rhonchi CV: Normal rate, regular rhythm, s1 and s2 distinct, no murmurs, clicks or rubs  Neuro: alert and oriented x 3, no tremor MSK: extremities atraumatic, normal gait and station Skin: intact, no rashes on exposed skin, no jaundice, no cyanosis Psych: well-groomed, cooperative, good eye contact, euthymic mood, affect mood-congruent, speech is articulate, and thought processes clear and goal-directed    No results found for this or any previous visit (from the past 72 hour(s)). No results found.    Assessment and Plan: 72 y.o. male with   .Justin Browning was seen today for uri.  Diagnoses and all orders for this visit:  Chronic  respiratory failure with hypoxia ([place])  Acute lower respiratory infection -     guaiFENesin-codeine 100-10 MG/5ML syrup; Take 2.5 mLs by mouth every 6 (six) hours as needed for up to 5 days for cough. -     doxycycline (VIBRA-TABS) 100 MG tablet; Take 1 tablet (100 mg total) by mouth 2 (two) times daily for 7 days.   Afebrile, no tachypnea, no tachycardia, no adventitious lung sounds Initial pulse ox 88% on RA at rest, recheck changing probe sights twice pulse ox  improved to 90-91% on RA at rest He is a little hypotensive today, but this is typical following dialysis  Multiple risk factors for pneumonia including dialysis, recent travel, history of pneumonia, chronic lung disease Starting doxycycline Counseled to continue his home oxygen, 2L nasal cannula  Patient education and anticipatory guidance given Patient agrees with treatment plan Follow-up as needed if symptoms worsen or fail to improve  Darlyne Russian PA-C

## 2017-12-25 NOTE — Patient Instructions (Signed)

## 2017-12-25 NOTE — Telephone Encounter (Signed)
Needs appointment

## 2017-12-31 ENCOUNTER — Telehealth: Payer: Self-pay

## 2017-12-31 ENCOUNTER — Ambulatory Visit: Payer: Medicare Other | Admitting: Podiatry

## 2017-12-31 MED ORDER — GUAIFENESIN-CODEINE 100-10 MG/5ML PO SYRP: 2.5000 mL | ORAL_SOLUTION | Freq: Four times a day (QID) | ORAL | 0 refills | Status: AC | PRN

## 2017-12-31 NOTE — Telephone Encounter (Signed)
Wife called requesting a refill cough med. Please advise. -EH/RMA

## 2017-12-31 NOTE — Telephone Encounter (Signed)
Refill provided for 3 days Would like him to follow-up with me or pulmonology if still having symptoms after 3 days

## 2018-01-02 NOTE — Telephone Encounter (Signed)
Wife notified -EH/RMA  

## 2018-01-06 ENCOUNTER — Ambulatory Visit (INDEPENDENT_AMBULATORY_CARE_PROVIDER_SITE_OTHER): Payer: Medicare Other | Admitting: Physician Assistant

## 2018-01-06 ENCOUNTER — Encounter: Payer: Self-pay | Admitting: Physician Assistant

## 2018-01-06 VITALS — BP 106/43 | HR 70 | Temp 98.9°F | Wt 251.0 lb

## 2018-01-06 DIAGNOSIS — I25119 Atherosclerotic heart disease of native coronary artery with unspecified angina pectoris: Secondary | ICD-10-CM | POA: Diagnosis not present

## 2018-01-06 DIAGNOSIS — J22 Unspecified acute lower respiratory infection: Secondary | ICD-10-CM

## 2018-01-06 DIAGNOSIS — J9611 Chronic respiratory failure with hypoxia: Secondary | ICD-10-CM | POA: Diagnosis not present

## 2018-01-06 MED ORDER — PREDNISONE 20 MG PO TABS: ORAL_TABLET | ORAL | 0 refills | Status: DC

## 2018-01-06 MED ORDER — SULFAMETHOXAZOLE-TRIMETHOPRIM 800-160 MG PO TABS: 1.0000 | ORAL_TABLET | Freq: Two times a day (BID) | ORAL | 0 refills | Status: DC

## 2018-01-06 NOTE — Progress Notes (Signed)
Subjective:    Patient ID: Justin Browning, male    DOB: 1945-05-26, 72 y.o.   MRN: [id number]  HPI  Pt is a 72 yo male with hx of restrictive lung disease and chronic respiratory failure with hypoxia on dialysis. 2 weeks ago was seen by PCP and given doxycycline. He was improving but 2 days after finishing doxy started to have more lower respiratory symptoms. His cough is productive and colored green. It has been 5 days since stopped abx and having more and more problems breathing. At home he is on 2L of O2 but he forgot his oxygen today.    .. Active Ambulatory Problems    Diagnosis Date Noted  . History of adenocarcinoma of lung 10/11/2016  . ESRD on dialysis ([place]) 10/11/2016  . Paroxysmal A-fib ([place]) 10/11/2016  . Type 2 diabetes mellitus with chronic kidney disease on chronic dialysis, without long-term current use of insulin ([place]) 10/11/2016  . Mixed hyperlipidemia 10/11/2016  . Artificial cardiac pacemaker 10/11/2016  . Coronary artery disease involving native coronary artery of native heart without angina pectoris 10/11/2016  . Anemia of chronic disease 10/11/2016  . Thrombocytopenia ([place]) 10/11/2016  . AICD (automatic cardioverter/defibrillator) present 10/11/2016  . Chronic congestive heart failure ([place]) 10/11/2016  . Dyspnea on exertion 05/13/2015  . Hx of CABG 10/11/2016  . Ischemic cardiomyopathy 10/09/2016  . Non-gaseous abdominal distention 10/11/2016  . Long term current use of anticoagulants with INR goal of 2.0-3.0 05/13/2015  . Enlarged prostate 10/11/2016  . Lumbar degenerative disc disease 10/11/2016  . CKD (chronic kidney disease) stage 5, GFR less than 15 ml/min (HCC) 01/13/2014  . Chest pain 06/19/2016  . Encounter for long-term (current) use of high-risk medication 01/31/2017  . Abdominal aortic aneurysm (AAA) without rupture ([place]) 01/31/2017  . Osteoarthritis of multiple joints 02/08/2017  . Requires continuous at home supplemental oxygen 02/08/2017  .  Iliac artery aneurysm, bilateral ([place]) 02/24/2017  . History of pneumonia 03/29/2017  . Chronic cough 03/29/2017  . Restrictive lung disease 05/14/2017  . Pleural effusion on right 05/16/2017  . Chronic respiratory failure with hypoxia ([place]) 08/05/2017  . Pulmonary nodules 08/05/2017  . Acute on chronic combined systolic and diastolic congestive heart failure ([place]) 08/06/2017  . Adhesive capsulitis of left shoulder 08/20/2017   Resolved Ambulatory Problems    Diagnosis Date Noted  . Atrial flutter ([place]) 09/23/2014  . VT (ventricular tachycardia) ([place]) 05/12/2010   Past Medical History:  Diagnosis Date  . Aneurysm ([place])   . CAD (coronary artery disease) 10/11/2016  . Chronic atrial fibrillation 10/11/2016  . Diabetes mellitus without complication ([place])   . ESRD (end stage renal disease) on dialysis ([place])   . Hernia of abdominal cavity   . Hyperlipidemia   . Sleep apnea      Review of Systems See HPI.     Objective:   Physical Exam Vitals signs reviewed.  Constitutional:      General: He is not in acute distress.    Appearance: He is ill-appearing.     Comments: In wheelchair.   HENT:     Head: Normocephalic and atraumatic.  Neck:     Musculoskeletal: Normal range of motion and neck supple.  Cardiovascular:     Rate and Rhythm: Normal rate and regular rhythm.  Pulmonary:     Comments: Some labored breathing before O2 placed in office.  Rhonchi and crackles heard bilateral lungs.  Neurological:     General: No focal deficit present.  Mental Status: He is alert.  Psychiatric:        Mood and Affect: Mood normal.           Assessment & Plan:  Marland KitchenMarland KitchenAldrick was seen today for cough.  Diagnoses and all orders for this visit:  Chronic respiratory failure with hypoxia (HCC) -     predniSONE (DELTASONE) 20 MG tablet; Take 3 tablets for 3 days, take 2 tablets for 3 days, take 1 tablet for 3 days, take 1/2 tablet for 4 days. -     sulfamethoxazole-trimethoprim  (BACTRIM DS,SEPTRA DS) 800-160 MG tablet; Take 1 tablet by mouth 2 (two) times daily. For 7 days.  Lower respiratory infection -     predniSONE (DELTASONE) 20 MG tablet; Take 3 tablets for 3 days, take 2 tablets for 3 days, take 1 tablet for 3 days, take 1/2 tablet for 4 days. -     sulfamethoxazole-trimethoprim (BACTRIM DS,SEPTRA DS) 800-160 MG tablet; Take 1 tablet by mouth 2 (two) times daily. For 7 days.   O2 was 85 percent without O2. Placed on 2L 02 and pulse ox went up to 95 percent.   zpak and levaquin both have an interaction with amiodarone. Will give bactrim and prednisone. Follow up in 1-2 week with PCP if not improving or sooner if worsening.

## 2018-01-08 ENCOUNTER — Encounter: Payer: Self-pay | Admitting: Physician Assistant

## 2018-01-21 ENCOUNTER — Other Ambulatory Visit: Payer: Self-pay | Admitting: Physician Assistant

## 2018-01-21 DIAGNOSIS — I480 Paroxysmal atrial fibrillation: Secondary | ICD-10-CM

## 2018-01-21 DIAGNOSIS — Z7901 Long term (current) use of anticoagulants: Secondary | ICD-10-CM

## 2018-01-29 MED ORDER — GENERIC EXTERNAL MEDICATION
10.00 | Status: DC
Start: ? — End: 2018-01-29

## 2018-01-29 MED ORDER — SODIUM CHLORIDE 0.9 % IV SOLN
10.00 | INTRAVENOUS | Status: DC
Start: ? — End: 2018-01-29

## 2018-02-01 MED ORDER — SENNA-DOCUSATE SODIUM 8.6-50 MG PO TABS
1.00 | ORAL_TABLET | ORAL | Status: DC
Start: ? — End: 2018-02-01

## 2018-02-01 MED ORDER — ASPIRIN 81 MG PO CHEW
81.00 | CHEWABLE_TABLET | ORAL | Status: DC
Start: 2018-02-01 — End: 2018-02-01

## 2018-02-01 MED ORDER — ALBUTEROL SULFATE HFA 108 (90 BASE) MCG/ACT IN AERS
2.00 | INHALATION_SPRAY | RESPIRATORY_TRACT | Status: DC
Start: ? — End: 2018-02-01

## 2018-02-01 MED ORDER — MUPIROCIN 2 % EX OINT
TOPICAL_OINTMENT | CUTANEOUS | Status: DC
Start: 2018-01-31 — End: 2018-02-01

## 2018-02-01 MED ORDER — GENERIC EXTERNAL MEDICATION
10.00 | Status: DC
Start: ? — End: 2018-02-01

## 2018-02-01 MED ORDER — GABAPENTIN 300 MG PO CAPS
300.00 | ORAL_CAPSULE | ORAL | Status: DC
Start: 2018-02-01 — End: 2018-02-01

## 2018-02-01 MED ORDER — TORSEMIDE 20 MG PO TABS
50.00 | ORAL_TABLET | ORAL | Status: DC
Start: 2018-02-01 — End: 2018-02-01

## 2018-02-01 MED ORDER — MORPHINE SULFATE (PF) 4 MG/ML IV SOLN
2.00 | INTRAVENOUS | Status: DC
Start: ? — End: 2018-02-01

## 2018-02-01 MED ORDER — HEPARIN SODIUM (PORCINE) 5000 UNIT/ML IJ SOLN
5000.00 | INTRAMUSCULAR | Status: DC
Start: 2018-01-31 — End: 2018-02-01

## 2018-02-01 MED ORDER — GENERIC EXTERNAL MEDICATION
650.00 | Status: DC
Start: ? — End: 2018-02-01

## 2018-02-01 MED ORDER — SEVELAMER CARBONATE 800 MG PO TABS
2400.00 | ORAL_TABLET | ORAL | Status: DC
Start: 2018-02-01 — End: 2018-02-01

## 2018-02-01 MED ORDER — FERROUS SULFATE 325 (65 FE) MG PO TABS
325.00 | ORAL_TABLET | ORAL | Status: DC
Start: 2018-02-01 — End: 2018-02-01

## 2018-02-01 MED ORDER — PANTOPRAZOLE SODIUM 40 MG PO TBEC
40.00 | DELAYED_RELEASE_TABLET | ORAL | Status: DC
Start: 2018-02-01 — End: 2018-02-01

## 2018-02-01 MED ORDER — SODIUM CHLORIDE 0.9 % IV SOLN
20.00 | INTRAVENOUS | Status: DC
Start: ? — End: 2018-02-01

## 2018-02-01 MED ORDER — AMIODARONE HCL 200 MG PO TABS
200.00 | ORAL_TABLET | ORAL | Status: DC
Start: 2018-02-01 — End: 2018-02-01

## 2018-02-01 MED ORDER — SODIUM CHLORIDE 0.9 % IV SOLN
10.00 | INTRAVENOUS | Status: DC
Start: ? — End: 2018-02-01

## 2018-02-01 MED ORDER — NITROGLYCERIN 0.4 MG SL SUBL
.40 | SUBLINGUAL_TABLET | SUBLINGUAL | Status: DC
Start: ? — End: 2018-02-01

## 2018-02-01 MED ORDER — ATORVASTATIN CALCIUM 40 MG PO TABS
40.00 | ORAL_TABLET | ORAL | Status: DC
Start: 2018-02-01 — End: 2018-02-01

## 2018-02-01 MED ORDER — GENERIC EXTERNAL MEDICATION
1.00 | Status: DC
Start: ? — End: 2018-02-01

## 2018-02-01 MED ORDER — CARVEDILOL 3.125 MG PO TABS
3.13 | ORAL_TABLET | ORAL | Status: DC
Start: 2018-02-03 — End: 2018-02-01

## 2018-02-12 ENCOUNTER — Other Ambulatory Visit: Payer: Self-pay | Admitting: Physician Assistant

## 2018-02-12 DIAGNOSIS — M5136 Other intervertebral disc degeneration, lumbar region: Secondary | ICD-10-CM

## 2018-02-18 ENCOUNTER — Other Ambulatory Visit: Payer: Self-pay

## 2018-02-18 DIAGNOSIS — I480 Paroxysmal atrial fibrillation: Secondary | ICD-10-CM

## 2018-02-18 DIAGNOSIS — Z7901 Long term (current) use of anticoagulants: Secondary | ICD-10-CM

## 2018-02-18 MED ORDER — APIXABAN 5 MG PO TABS: 5.0000 mg | ORAL_TABLET | Freq: Two times a day (BID) | ORAL | 0 refills | Status: DC

## 2018-02-26 ENCOUNTER — Telehealth: Payer: Self-pay | Admitting: Physician Assistant

## 2018-02-26 NOTE — Telephone Encounter (Signed)
Patient's wife Ricci Barker dropped off an [employer] form for completion. Unfortunately she filled in the physician section and there are some errors, so I will need a new copy. I called Elvira and informed her. She is going to contact them and request Form [id number] and have it faxed to [name]

## 2018-02-28 ENCOUNTER — Telehealth: Payer: Self-pay | Admitting: Physician Assistant

## 2018-03-02 NOTE — Telephone Encounter (Signed)
Erroneous encounter

## 2018-03-20 ENCOUNTER — Encounter: Payer: Self-pay | Admitting: Physician Assistant

## 2018-03-20 ENCOUNTER — Ambulatory Visit (INDEPENDENT_AMBULATORY_CARE_PROVIDER_SITE_OTHER): Payer: Medicare Other | Admitting: Physician Assistant

## 2018-03-20 VITALS — BP 95/55 | HR 70 | Temp 98.2°F

## 2018-03-20 DIAGNOSIS — R600 Localized edema: Secondary | ICD-10-CM

## 2018-03-20 DIAGNOSIS — Z992 Dependence on renal dialysis: Secondary | ICD-10-CM | POA: Diagnosis not present

## 2018-03-20 DIAGNOSIS — D638 Anemia in other chronic diseases classified elsewhere: Secondary | ICD-10-CM

## 2018-03-20 DIAGNOSIS — E1122 Type 2 diabetes mellitus with diabetic chronic kidney disease: Secondary | ICD-10-CM | POA: Diagnosis not present

## 2018-03-20 DIAGNOSIS — I953 Hypotension of hemodialysis: Secondary | ICD-10-CM | POA: Diagnosis not present

## 2018-03-20 DIAGNOSIS — R609 Edema, unspecified: Secondary | ICD-10-CM | POA: Diagnosis not present

## 2018-03-20 DIAGNOSIS — N186 End stage renal disease: Secondary | ICD-10-CM | POA: Diagnosis not present

## 2018-03-20 DIAGNOSIS — G63 Polyneuropathy in diseases classified elsewhere: Secondary | ICD-10-CM | POA: Diagnosis not present

## 2018-03-20 LAB — POCT GLYCOSYLATED HEMOGLOBIN (HGB A1C): HbA1c POC (<> result, manual entry): 5.5 % (ref 4.0–5.6)

## 2018-03-20 MED ORDER — LINAGLIPTIN 5 MG PO TABS: 2.5000 mg | ORAL_TABLET | Freq: Every day | ORAL | 1 refills | Status: DC

## 2018-03-20 NOTE — Progress Notes (Signed)
HPI:                                                                Justin Browning is a 73 y.o. male who presents to [facility] today for left foot swelling  Wife noted worsening left foot swelling beginning today.  He had dialysis yesterday. Lennex denies noticing any change in his foot, but admits he has bad neuropathy and doesn't feel his feet. Wife contacted Nephrology and they instructed her to double his dose of Torsemide today, which they did.  Clare also states his hands are always cold and he feels lightheaded with position changes. Ambulates with a walker at home. Denies any falls in the last year.  Jaquawn would like a prescription for Viagra as well.   Past Medical History:  Diagnosis Date  . Abdominal aortic aneurysm (AAA) without rupture ([place]) 01/31/2017  . Anemia of chronic disease 10/11/2016  . Aneurysm ([place])    multiple   . Atrial flutter ([place]) 09/23/2014  . CAD (coronary artery disease) 10/11/2016   Overview:  Non STEMI 1/06 Cardiac Catheterization 1/06-Total occlusion of the LAD,OM1,LCX,RPDA CABG 02/15/04(LIMA-LAD,SVG-OM1,SVG-OM2-OM3) Postoperative course complicated by recurrent ventricular and atrial arrhythmias.Acute abdomen requiring exploratory laparotomy, partial resection of the rectosigmoid colon, diverting colostomy s/p revision. AICD 2/06  . Chronic atrial fibrillation 10/11/2016  . Chronic congestive heart failure ([place]) 10/11/2016  . Diabetes mellitus without complication ([place])   . Dyspnea on exertion 05/13/2015  . ESRD (end stage renal disease) on dialysis ([place])   . ESRD on dialysis ([place]) 10/11/2016  . Hernia of abdominal cavity   . History of adenocarcinoma of lung 10/11/2016   s/p partial lobectomy right lung  . Hyperlipidemia   . Ischemic cardiomyopathy 10/09/2016  . Lumbar degenerative disc disease 10/11/2016  . Mixed hyperlipidemia 10/11/2016  . Sleep apnea   . Thrombocytopenia ([place]) 10/11/2016  . Type 2  diabetes mellitus with chronic kidney disease on chronic dialysis, without long-term current use of insulin ([place]) 10/11/2016  . VT (ventricular tachycardia) ([place]) 05/12/2010   Overview:  AICD 03/09/04 [employer]   Past Surgical History:  Procedure Laterality Date  . CARDIAC DEFIBRILLATOR PLACEMENT    . CARDIAC SURGERY     Quad Bypass  . COLON SURGERY    . CORONARY ARTERY BYPASS GRAFT    . CORONARY ARTERY BYPASS GRAFT    . [place]  . KNEE SURGERY Bilateral   . LUNG LOBECTOMY Right   . PARTIAL COLECTOMY    . REPLACEMENT TOTAL KNEE Left    Social History   Tobacco Use  . Smoking status: Former Smoker    Packs/day: 3.00    Years: 40.00    Pack years: 120.00    Types: Cigarettes    Last attempt to quit: 08/20/2004    Years since quitting: 13.5  . Smokeless tobacco: Never Used  Substance Use Topics  . Alcohol use: No   family history includes Cancer in his father; Dementia in his mother; Diabetes in his mother; Stroke in his mother.    ROS: negative except as noted in the HPI  Medications: Current Outpatient Medications  Medication Sig Dispense Refill  . amiodarone (PACERONE) 200 MG tablet Take 200 mg by mouth daily.    [name]  apixaban (ELIQUIS) 5 MG TABS tablet Take 1 tablet (5 mg total) by mouth 2 (two) times daily. 180 tablet 0  . aspirin 81 MG chewable tablet Chew by mouth daily.    [name] atorvastatin (LIPITOR) 20 MG tablet Take 1 tablet (20 mg total) by mouth daily at 6 PM. Due for lab 30 tablet 0  . carvedilol (COREG) 6.25 MG tablet Take 3.125 mg by mouth 2 (two) times daily with a meal.     . gabapentin (NEURONTIN) 300 MG capsule TAKE 1 CAPSULE BY MOUTH DAILY 90 capsule 0  . linagliptin (TRADJENTA) 5 MG TABS tablet Take 1 tablet (5 mg total) by mouth daily. PLEASE MAKE APPOINTMENT 90 tablet 0  . MULTIPLE VITAMIN PO Take by mouth.    . NONFORMULARY OR COMPOUNDED ITEM Home oxygen 3L per minute on nasal cannula 1 each 0  . OXYGEN Inhale 2 L into the lungs as needed.     . pantoprazole (PROTONIX) 20 MG tablet TAKE 2 TABLETS(40 MG) BY MOUTH DAILY 180 tablet 0  . sevelamer carbonate (RENVELA) 800 MG tablet Take 800 mg by mouth 3 (three) times daily with meals.    . torsemide (DEMADEX) 100 MG tablet Take 50 mg by mouth. Take 1/2 tab PO on nondialysis days     No current facility-administered medications for this visit.    No Known Allergies     Objective:  BP (!) 95/55   Pulse 70   Temp 98.2 F (36.8 C) (Oral)  Gen:  alert, not ill-appearing, no distress, appropriate for age 32: head normocephalic without obvious abnormality, conjunctiva and cornea clear, trachea midline Pulm: Normal work of breathing, normal phonation, clear to auscultation bilaterally, no wheezes, rales or rhonchi CV: Normal rate, regular rhythm, s1 and s2 distinct, no murmurs, clicks or rubs  Neuro: alert and oriented x 3, no tremor MSK: extremities atraumatic, normal gait and station,  Left foot: atraumatic, nonpitting edema of left forefoot, distal pulses intact Skin: intact, no rashes on exposed skin, no jaundice, no cyanosis Psych: well-groomed, cooperative, good eye contact, euthymic mood, affect mood-congruent, speech is articulate, and thought processes clear and goal-directed   BP Readings from Last 3 Encounters:  03/20/18 (!) 95/55  01/06/18 (!) 106/43  12/25/17 (!) 91/52   Lab Results  Component Value Date   HGBA1C 5.5 03/20/2018    Assessment and Plan: 73 y.o. male with   .[name] was seen today for foot swelling.  Diagnoses and all orders for this visit:  Type 2 diabetes mellitus with chronic kidney disease on chronic dialysis, without long-term current use of insulin (HCC) -     Discontinue: linagliptin (TRADJENTA) 5 MG TABS tablet; Take 0.5 tablets (2.5 mg total) by mouth daily. -     POCT HgB A1C  Hemodialysis-associated hypotension  Mild peripheral edema  Polyneuropathy associated with underlying disease ([place])   Personally reviewed lab  results in care everywhere from 01/31/2018 showing a stable mild leukocytosis of 4.3, stable normocytic anemia of 10.4, stable thrombocytopenia of 92, creatinine of 6.0 Otherwise metabolic panel is unremarkable.  Lipid profile is normal apart from low HDL.  TSH is therapeutic at 2.16  Blood pressure is a little soft in the office today, likely due to doubling dose of torsemide as well as recent dialysis yesterday.  Explained that hemodialysis associated hypotension is a likely cause of his cold extremities (as well as his anemia) and lightheadedness with position changes is due to orthostatic hypotension.  Recommended he take his time with  position changes and wear compressive socks which will also help with his peripheral edema.  Also recommended he discuss this with his nephrologist.  Recommended Pilar Plate and his wife discussed his foot swelling with the nephrologist at dialysis tomorrow.  Apart from mild non-pitting pedal edema, there is no evidence of fluid overload on exam. Encouraged to elevate his legs and wear compression socks.  Diabetes is very well controlled. A1C 5.5.  Given that he is having frequent lightheadedness and A1c is below 6 recommend he reduce his Tradjenta dose to 2-1/2 mg daily.  Reminded to schedule his annual diabetic eye exam.  Wife states they have an appointment upcoming  Explained that Aadi will need to consult his cardiologist to determine if he is healthy enough for sexual activity before we can consider prescribing Viagra.  Patient education and anticipatory guidance given Patient agrees with treatment plan Follow-up in 6 months for diabetes or sooner as needed if symptoms worsen or fail to improve  Darlyne Russian PA-C

## 2018-03-20 NOTE — Patient Instructions (Addendum)
Tell Nephrologist that blood pressure has been running a little low and you having been feeling dizzy  Wear compression stockings to help with swelling Elevate your legs above level of your heart     Peripheral Edema  Peripheral edema is swelling that is caused by a buildup of fluid. Peripheral edema most often affects the lower legs, ankles, and feet. It can also develop in the arms, hands, and face. The area of the body that has peripheral edema will look swollen. It may also feel heavy or warm. Your clothes may start to feel tight. Pressing on the area may make a temporary dent in your skin. You may not be able to move your arm or leg as much as usual. There are many causes of peripheral edema. It can be a complication of other diseases, such as congestive heart failure, kidney disease, or a problem with your blood circulation. It also can be a side effect of certain medicines. It often happens to women during pregnancy. Sometimes, the cause is not known. Treating the underlying condition is often the only treatment for peripheral edema. Follow these instructions at home: Pay attention to any changes in your symptoms. Take these actions to help with your discomfort:  Raise (elevate) your legs while you are sitting or lying down.  Move around often to prevent stiffness and to lessen swelling. Do not sit or stand for long periods of time.  Wear support stockings as told by your health care provider.  Follow instructions from your health care provider about limiting salt (sodium) in your diet. Sometimes eating less salt can reduce swelling.  Take over-the-counter and prescription medicines only as told by your health care provider. Your health care provider may prescribe medicine to help your body get rid of excess water (diuretic).  Keep all follow-up visits as told by your health care provider. This is important. Contact a health care provider if:  You have a fever.  Your edema starts  suddenly or is getting worse, especially if you are pregnant or have a medical condition.  You have swelling in only one leg.  You have increased swelling and pain in your legs. Get help right away if:  You develop shortness of breath, especially when you are lying down.  You have pain in your chest or abdomen.  You feel weak.  You faint. This information is not intended to replace advice given to you by your health care provider. Make sure you discuss any questions you have with your health care provider. Document Released: 02/09/2004 Document Revised: 06/06/2015 Document Reviewed: 07/14/2014 Elsevier Interactive Patient Education  2019 [employer].

## 2018-03-24 ENCOUNTER — Other Ambulatory Visit: Payer: Self-pay | Admitting: Physician Assistant

## 2018-03-24 DIAGNOSIS — N186 End stage renal disease: Principal | ICD-10-CM

## 2018-03-24 DIAGNOSIS — E1122 Type 2 diabetes mellitus with diabetic chronic kidney disease: Secondary | ICD-10-CM

## 2018-03-24 DIAGNOSIS — Z992 Dependence on renal dialysis: Principal | ICD-10-CM

## 2018-03-25 ENCOUNTER — Encounter: Payer: Self-pay | Admitting: Physician Assistant

## 2018-03-25 DIAGNOSIS — G63 Polyneuropathy in diseases classified elsewhere: Secondary | ICD-10-CM | POA: Insufficient documentation

## 2018-04-01 ENCOUNTER — Other Ambulatory Visit: Payer: Self-pay | Admitting: Physician Assistant

## 2018-04-01 DIAGNOSIS — Z7901 Long term (current) use of anticoagulants: Secondary | ICD-10-CM

## 2018-04-01 DIAGNOSIS — I480 Paroxysmal atrial fibrillation: Secondary | ICD-10-CM

## 2018-05-11 ENCOUNTER — Other Ambulatory Visit: Payer: Self-pay | Admitting: Physician Assistant

## 2018-05-11 DIAGNOSIS — M5136 Other intervertebral disc degeneration, lumbar region: Secondary | ICD-10-CM

## 2018-05-13 ENCOUNTER — Ambulatory Visit (INDEPENDENT_AMBULATORY_CARE_PROVIDER_SITE_OTHER): Payer: Medicare Other | Admitting: Sports Medicine

## 2018-05-13 ENCOUNTER — Ambulatory Visit (INDEPENDENT_AMBULATORY_CARE_PROVIDER_SITE_OTHER): Payer: Medicare Other

## 2018-05-13 ENCOUNTER — Other Ambulatory Visit: Payer: Self-pay

## 2018-05-13 ENCOUNTER — Encounter: Payer: Self-pay | Admitting: Sports Medicine

## 2018-05-13 DIAGNOSIS — M19012 Primary osteoarthritis, left shoulder: Secondary | ICD-10-CM | POA: Diagnosis not present

## 2018-05-13 DIAGNOSIS — M25512 Pain in left shoulder: Secondary | ICD-10-CM

## 2018-05-13 DIAGNOSIS — M25511 Pain in right shoulder: Secondary | ICD-10-CM

## 2018-05-13 DIAGNOSIS — M19011 Primary osteoarthritis, right shoulder: Secondary | ICD-10-CM

## 2018-05-13 DIAGNOSIS — L989 Disorder of the skin and subcutaneous tissue, unspecified: Secondary | ICD-10-CM

## 2018-05-13 NOTE — Assessment & Plan Note (Signed)
Bilateral glenohumeral injections. X-rays. Home health physical therapy. Return in 6 weeks.

## 2018-05-13 NOTE — Assessment & Plan Note (Signed)
Surgical excision, return in 10 days for suture removal.

## 2018-05-13 NOTE — Progress Notes (Signed)
Subjective:    CC: Shoulder pain  HPI: Justin Browning is a pleasant 73 year old male with multiple medical problems, for the past several weeks he has had pain in both shoulders, localized over the deltoids, worse with overhead activities, no radiation, no paresthesias, no trauma.  Minimal neck pain as well.  I reviewed the past medical history, family history, social history, surgical history, and allergies today and no changes were needed.  Please see the problem list section below in epic for further details.  Past Medical History: Past Medical History:  Diagnosis Date  . Abdominal aortic aneurysm (AAA) without rupture ([place]) 01/31/2017  . Anemia of chronic disease 10/11/2016  . Aneurysm ([place])    multiple   . Atrial flutter ([place]) 09/23/2014  . CAD (coronary artery disease) 10/11/2016   Overview:  Non STEMI 1/06 Cardiac Catheterization 1/06-Total occlusion of the LAD,OM1,LCX,RPDA CABG 02/15/04(LIMA-LAD,SVG-OM1,SVG-OM2-OM3) Postoperative course complicated by recurrent ventricular and atrial arrhythmias.Acute abdomen requiring exploratory laparotomy, partial resection of the rectosigmoid colon, diverting colostomy s/p revision. AICD 2/06  . Chronic atrial fibrillation 10/11/2016  . Chronic congestive heart failure ([place]) 10/11/2016  . Diabetes mellitus without complication ([place])   . Dyspnea on exertion 05/13/2015  . ESRD (end stage renal disease) on dialysis ([place])   . ESRD on dialysis ([place]) 10/11/2016  . Hernia of abdominal cavity   . History of adenocarcinoma of lung 10/11/2016   s/p partial lobectomy right lung  . Hyperlipidemia   . Ischemic cardiomyopathy 10/09/2016  . Lumbar degenerative disc disease 10/11/2016  . Mixed hyperlipidemia 10/11/2016  . Sleep apnea   . Thrombocytopenia ([place]) 10/11/2016  . Type 2 diabetes mellitus with chronic kidney disease on chronic dialysis, without long-term current use of insulin ([place]) 10/11/2016  . VT (ventricular tachycardia) ([place]) 05/12/2010   Overview:  AICD  03/09/04 [employer]   Past Surgical History: Past Surgical History:  Procedure Laterality Date  . CARDIAC DEFIBRILLATOR PLACEMENT    . CARDIAC SURGERY     Quad Bypass  . COLON SURGERY    . CORONARY ARTERY BYPASS GRAFT    . CORONARY ARTERY BYPASS GRAFT    . [place]  . KNEE SURGERY Bilateral   . LUNG LOBECTOMY Right   . PARTIAL COLECTOMY    . REPLACEMENT TOTAL KNEE Left    Social History: Social History   Socioeconomic History  . Marital status: Married    Spouse name: Not on file  . Number of children: Not on file  . Years of education: Not on file  . Highest education level: Not on file  Occupational History  . Not on file  Social Needs  . Financial resource strain: Not on file  . Food insecurity:    Worry: Not on file    Inability: Not on file  . Transportation needs:    Medical: Not on file    Non-medical: Not on file  Tobacco Use  . Smoking status: Former Smoker    Packs/day: 3.00    Years: 40.00    Pack years: 120.00    Types: Cigarettes    Last attempt to quit: 08/20/2004    Years since quitting: 13.7  . Smokeless tobacco: Never Used  Substance and Sexual Activity  . Alcohol use: No  . Drug use: No  . Sexual activity: Not Currently  Lifestyle  . Physical activity:    Days per week: Not on file    Minutes per session: Not on file  . Stress: Not on file  Relationships  .  Social connections:    Talks on phone: Not on file    Gets together: Not on file    Attends religious service: Not on file    Active member of club or organization: Not on file    Attends meetings of clubs or organizations: Not on file    Relationship status: Not on file  Other Topics Concern  . Not on file  Social History Narrative  . Not on file   Family History: Family History  Problem Relation Age of Onset  . Dementia Mother   . Diabetes Mother   . Stroke Mother   . Cancer Father        colon   Allergies: No Known Allergies Medications: See med rec.   Review of Systems: No fevers, chills, night sweats, weight loss, chest pain, or shortness of breath.   Objective:    General: Well Developed, well nourished, and in no acute distress.  Neuro: Alert and oriented x3, extra-ocular muscles intact, sensation grossly intact.  HEENT: Normocephalic, atraumatic, pupils equal round reactive to light, neck supple, no masses, no lymphadenopathy, thyroid nonpalpable.  Skin: Warm and dry, no rashes.  There is a 2-1/2 cm ulcerated lesion on the back of the right shoulder, I can feel a subcutaneous mass underneath as well. Cardiac: Regular rate and rhythm, no murmurs rubs or gallops, no lower extremity edema.  Respiratory: Clear to auscultation bilaterally. Not using accessory muscles, speaking in full sentences. Shoulders: External rotation to about 20 degrees, pain with abduction, for the most part he has a negative Neer's, Hawkins signs.  Procedure: Real-time Ultrasound Guided injection of the right glenohumeral joint Device: GE Logiq E  Verbal informed consent obtained.  Time-out conducted.  Noted no overlying erythema, induration, or other signs of local infection.  Skin prepped in a sterile fashion.  Local anesthesia: Topical Ethyl chloride.  With sterile technique and under real time ultrasound guidance:  Taking care to avoid the labrum I advanced a 22-gauge spinal needle into the glenohumeral joint from a posterior approach, I then injected 1 cc Kenalog 40, 2 cc lidocaine, 2 cc bupivacaine. Completed without difficulty  Pain immediately resolved suggesting accurate placement of the medication.  Advised to call if fevers/chills, erythema, induration, drainage, or persistent bleeding.  Images permanently stored and available for review in the ultrasound unit.  Impression: Technically successful ultrasound guided injection.  Procedure: Real-time Ultrasound Guided injection of the left glenohumeral joint Device: GE Logiq E  Verbal informed consent  obtained.  Time-out conducted.  Noted no overlying erythema, induration, or other signs of local infection.  Skin prepped in a sterile fashion.  Local anesthesia: Topical Ethyl chloride.  With sterile technique and under real time ultrasound guidance:  Taking care to avoid the labrum I advanced a 22-gauge spinal needle into the glenohumeral joint from a posterior approach, I then injected 1 cc Kenalog 40, 2 cc lidocaine, 2 cc bupivacaine. Completed without difficulty  Pain immediately resolved suggesting accurate placement of the medication.  Advised to call if fevers/chills, erythema, induration, drainage, or persistent bleeding.  Images permanently stored and available for review in the ultrasound unit.  Impression: Technically successful ultrasound guided injection.  Impression and Recommendations:    Primary osteoarthritis of both shoulders Bilateral glenohumeral injections. X-rays. Home health physical therapy. Return in 6 weeks.  Skin lesion of back Ulcerated appearance, right posterior shoulder, he will return later today for surgical excision. We will only code the procedure charge and not an E&M.   ___________________________________________  Gwen Her. Dianah Field, M.D., ABFM., CAQSM. Primary Care and Sports Medicine [facility]  Adjunct Professor of [employer]

## 2018-05-13 NOTE — Progress Notes (Signed)
   Procedure:  Excision of 2 cm right posterior shoulder subcutaneous tumor Risks, benefits, and alternatives explained and consent obtained. Time out conducted. Surface prepped with alcohol. 10cc lidocaine with epinephine infiltrated in a field block. Adequate anesthesia ensured. Area prepped and draped in a sterile fashion. Excision performed with: Using a #10 blade I made an elliptical incision, then using sharp and blunt dissection I removed the entire mass down into the subcutaneous tissues.  I then closed the incision with #4 simple interrupted 3-0 Ethilon sutures. Hemostasis achieved. Pt stable.

## 2018-05-13 NOTE — Assessment & Plan Note (Addendum)
Ulcerated appearance, right posterior shoulder, he will return later today for surgical excision. We will only code the procedure charge and not an E&M.

## 2018-05-23 ENCOUNTER — Ambulatory Visit (INDEPENDENT_AMBULATORY_CARE_PROVIDER_SITE_OTHER): Payer: Medicare Other | Admitting: Sports Medicine

## 2018-05-23 DIAGNOSIS — L989 Disorder of the skin and subcutaneous tissue, unspecified: Secondary | ICD-10-CM

## 2018-05-23 NOTE — Progress Notes (Signed)
  Subjective: Surgical excision of a sebaceous cyst approximately 10 days ago, doing well.  Objective: General: Well-developed, well-nourished, and in no acute distress. Incision: Clean, dry, intact, 4 sutures removed, Dermabond applied.  Assessment/plan:   Skin lesion of back Surgical clear, sutures removed, incision looks good. Dermabond applied to reinforce. Return as needed.   ___________________________________________ Gwen Her. Dianah Field, M.D., ABFM., CAQSM. Primary Care and Sports Medicine [facility]  Adjunct Professor of [employer]

## 2018-05-23 NOTE — Assessment & Plan Note (Signed)
Surgical clear, sutures removed, incision looks good. Dermabond applied to reinforce. Return as needed.

## 2018-06-03 ENCOUNTER — Other Ambulatory Visit: Payer: Self-pay | Admitting: Physician Assistant

## 2018-06-03 DIAGNOSIS — I25119 Atherosclerotic heart disease of native coronary artery with unspecified angina pectoris: Secondary | ICD-10-CM

## 2018-06-03 DIAGNOSIS — E782 Mixed hyperlipidemia: Secondary | ICD-10-CM

## 2018-06-05 ENCOUNTER — Other Ambulatory Visit: Payer: Self-pay | Admitting: Physician Assistant

## 2018-06-05 DIAGNOSIS — E782 Mixed hyperlipidemia: Secondary | ICD-10-CM

## 2018-06-05 DIAGNOSIS — I25119 Atherosclerotic heart disease of native coronary artery with unspecified angina pectoris: Secondary | ICD-10-CM

## 2018-06-17 ENCOUNTER — Ambulatory Visit (INDEPENDENT_AMBULATORY_CARE_PROVIDER_SITE_OTHER): Payer: Medicare Other | Admitting: Physician Assistant

## 2018-06-17 ENCOUNTER — Ambulatory Visit: Payer: Self-pay | Admitting: Vascular Surgery

## 2018-06-17 ENCOUNTER — Encounter: Payer: Self-pay | Admitting: Physician Assistant

## 2018-06-17 VITALS — Wt 250.0 lb

## 2018-06-17 DIAGNOSIS — F411 Generalized anxiety disorder: Secondary | ICD-10-CM

## 2018-06-17 DIAGNOSIS — F419 Anxiety disorder, unspecified: Secondary | ICD-10-CM

## 2018-06-17 DIAGNOSIS — F329 Major depressive disorder, single episode, unspecified: Secondary | ICD-10-CM

## 2018-06-17 DIAGNOSIS — F5105 Insomnia due to other mental disorder: Secondary | ICD-10-CM

## 2018-06-17 DIAGNOSIS — F418 Other specified anxiety disorders: Secondary | ICD-10-CM | POA: Diagnosis not present

## 2018-06-17 DIAGNOSIS — M5136 Other intervertebral disc degeneration, lumbar region: Secondary | ICD-10-CM

## 2018-06-17 DIAGNOSIS — F32A Depression, unspecified: Secondary | ICD-10-CM

## 2018-06-17 MED ORDER — GABAPENTIN 300 MG PO CAPS: 600.0000 mg | ORAL_CAPSULE | Freq: Every day | ORAL | 1 refills | Status: DC

## 2018-06-17 MED ORDER — SERTRALINE HCL 25 MG PO TABS: 25.0000 mg | ORAL_TABLET | Freq: Every day | ORAL | 1 refills | Status: DC

## 2018-06-17 NOTE — Progress Notes (Signed)
Virtual Visit via Telephone Note  I connected with Justin Browning on 07/03/18 at  4:00 PM EDT by telephone and verified that I am speaking with the correct person using two identifiers.   I discussed the limitations, risks, security and privacy concerns of performing an evaluation and management service by telephone and the availability of in person appointments. I also discussed with the patient that there may be a patient responsible charge related to this service. The patient expressed understanding and agreed to proceed.   History of Present Illness: HPI:                                                                Justin Browning is a 73 y.o. male with PMH of CHF, CAD, Afib, OSA, ESRD on dialysis, DM2, neuropathy  Patient and wife are present on the phone and provide history  CC: sleep issues  Reports falling asleep and waking up about an hour later He sleeps either on the recliner or on the couch while watching tv Reports this has been a "minor problem" for many years. For the last 3 nights he states it has been terrible and he reports he was disoriented and did not know where he was. Reports falling asleep watching tv and sometimes will feel like he is in the film or becomes a part of the program he is watching. He does not wear his home oxygen  Mood - "I am a little depressed" Wife states he has been depressed for years Anxiety - Reports he is "stressed about everything"   Past Medical History:  Diagnosis Date  . Abdominal aortic aneurysm (AAA) without rupture ([place]) 01/31/2017  . Anemia of chronic disease 10/11/2016  . Aneurysm ([place])    multiple   . Atrial flutter ([place]) 09/23/2014  . CAD (coronary artery disease) 10/11/2016   Overview:  Non STEMI 1/06 Cardiac Catheterization 1/06-Total occlusion of the LAD,OM1,LCX,RPDA CABG 02/15/04(LIMA-LAD,SVG-OM1,SVG-OM2-OM3) Postoperative course complicated by recurrent ventricular and atrial arrhythmias.Acute abdomen requiring exploratory  laparotomy, partial resection of the rectosigmoid colon, diverting colostomy s/p revision. AICD 2/06  . Chronic atrial fibrillation 10/11/2016  . Chronic congestive heart failure ([place]) 10/11/2016  . Diabetes mellitus without complication ([place])   . Dyspnea on exertion 05/13/2015  . ESRD (end stage renal disease) on dialysis ([place])   . ESRD on dialysis ([place]) 10/11/2016  . Hernia of abdominal cavity   . History of adenocarcinoma of lung 10/11/2016   s/p partial lobectomy right lung  . Hyperlipidemia   . Ischemic cardiomyopathy 10/09/2016  . Lumbar degenerative disc disease 10/11/2016  . Mixed hyperlipidemia 10/11/2016  . Sleep apnea   . Thrombocytopenia ([place]) 10/11/2016  . Type 2 diabetes mellitus with chronic kidney disease on chronic dialysis, without long-term current use of insulin ([place]) 10/11/2016  . VT (ventricular tachycardia) ([place]) 05/12/2010   Overview:  AICD 03/09/04 [employer]   Past Surgical History:  Procedure Laterality Date  . CARDIAC DEFIBRILLATOR PLACEMENT    . CARDIAC SURGERY     Quad Bypass  . COLON SURGERY    . CORONARY ARTERY BYPASS GRAFT    . CORONARY ARTERY BYPASS GRAFT    . [place]  . KNEE SURGERY Bilateral   . LUNG LOBECTOMY Right   . PARTIAL COLECTOMY    .  REPLACEMENT TOTAL KNEE Left    Social History   Tobacco Use  . Smoking status: Former Smoker    Packs/day: 3.00    Years: 40.00    Pack years: 120.00    Types: Cigarettes    Quit date: 08/20/2004    Years since quitting: 13.8  . Smokeless tobacco: Never Used  Substance Use Topics  . Alcohol use: No   family history includes Cancer in his father; Dementia in his mother; Diabetes in his mother; Stroke in his mother.    ROS: negative except as noted in the HPI  Medications: Current Outpatient Medications  Medication Sig Dispense Refill  . amiodarone (PACERONE) 200 MG tablet Take 200 mg by mouth daily.    [name] aspirin 81 MG chewable tablet Chew by mouth daily.    . carvedilol (COREG)  3.125 MG tablet Take 3.125 mg by mouth 2 (two) times daily with a meal.    . ELIQUIS 5 MG TABS tablet TAKE 1 TABLET BY MOUTH TWICE DAILY 180 tablet 0  . gabapentin (NEURONTIN) 300 MG capsule Take 2 capsules (600 mg total) by mouth at bedtime. 180 capsule 1  . MULTIPLE VITAMIN PO Take by mouth.    . NONFORMULARY OR COMPOUNDED ITEM Home oxygen 3L per minute on nasal cannula 1 each 0  . OXYGEN Inhale 2 L into the lungs as needed.    . pantoprazole (PROTONIX) 20 MG tablet TAKE 2 TABLETS(40 MG) BY MOUTH DAILY 180 tablet 0  . sevelamer carbonate (RENVELA) 800 MG tablet Take 800 mg by mouth 3 (three) times daily with meals.    . torsemide (DEMADEX) 100 MG tablet Take 50 mg by mouth. Take 1/2 tab PO on nondialysis days    . TRADJENTA 5 MG TABS tablet TAKE 1 TABLET BY MOUTH DAILY 90 tablet 1  . atorvastatin (LIPITOR) 20 MG tablet Take 1 tablet (20 mg total) by mouth daily. Due for labs 90 tablet 0  . sertraline (ZOLOFT) 25 MG tablet Take 1 tablet (25 mg total) by mouth at bedtime. 90 tablet 0   No current facility-administered medications for this visit.    No Known Allergies     Objective:  Wt 250 lb (113.4 kg)   BMI 32.10 kg/m  Neuro: alert and oriented x 3 Psych: cooperative, "a little depressed" mood, affect mood-congruent, speech is articulate, normal rate and volume; thought processes clear and goal-directed, normal judgment, good insight    Assessment and Plan: 73 y.o. male with   .Diagnoses and all orders for this visit:  Anxiety about health -     Ambulatory referral to Psychology  Lumbar degenerative disc disease -     gabapentin (NEURONTIN) 300 MG capsule; Take 2 capsules (600 mg total) by mouth at bedtime.  GAD (generalized anxiety disorder) -     Discontinue: sertraline (ZOLOFT) 25 MG tablet; Take 1 tablet (25 mg total) by mouth at bedtime.  Insomnia secondary to anxiety -     gabapentin (NEURONTIN) 300 MG capsule; Take 2 capsules (600 mg total) by mouth at bedtime. -      Ambulatory referral to Psychology   Start low-dose Sertraline 25 mg QHS (will avoid other SSRI's due to ESRD) Increasing Gabapentin to 600 mg QHS Encouraged to wear home oxygen at bedtime Referral placed to [facility] for counseling to help cope with anxiety relating to chronic disease  Follow-up televisit in 2 weeks  Follow Up Instructions:    I discussed the assessment and treatment plan with the patient.  The patient was provided an opportunity to ask questions and all were answered. The patient agreed with the plan and demonstrated an understanding of the instructions.   The patient was advised to call back or seek an in-person evaluation if the symptoms worsen or if the condition fails to improve as anticipated.  I provided 11-20 minutes of non-face-to-face time during this encounter.   Trixie Dredge, [place]

## 2018-06-24 ENCOUNTER — Other Ambulatory Visit: Payer: Self-pay

## 2018-06-24 DIAGNOSIS — I714 Abdominal aortic aneurysm, without rupture, unspecified: Secondary | ICD-10-CM

## 2018-06-26 ENCOUNTER — Ambulatory Visit (HOSPITAL_COMMUNITY)
Admission: RE | Admit: 2018-06-26 | Discharge: 2018-06-26 | Disposition: A | Discharge status: Home / Self Care | Payer: Medicare Other | Source: Ambulatory Visit | Attending: Family | Admitting: Family

## 2018-06-26 ENCOUNTER — Other Ambulatory Visit: Payer: Self-pay

## 2018-06-26 DIAGNOSIS — I714 Abdominal aortic aneurysm, without rupture, unspecified: Secondary | ICD-10-CM

## 2018-06-30 ENCOUNTER — Telehealth: Payer: Self-pay | Admitting: *Deleted

## 2018-06-30 NOTE — Telephone Encounter (Signed)
Virtual Visit Pre-Appointment Phone Call  Today, I spoke with Justin Browning (Spouse) Justin Browning and performed the following actions:  1. I explained that we are currently trying to limit exposure to the COVID-19 virus by seeing patients at home rather than in the office.  I explained that the visits are best done by video, but can be done by telephone.  I asked the patient if a virtual visit that the patient would like to try instead of coming into the office. Justin Browning agreed to proceed with the virtual visit scheduled with Dr. Sherren Mocha Early on 07/01/18.          If the patient said yes, I continued with 2 (below).  If the patient said no, I discussed other options and documented those and did not continue to 2.  2. I confirmed the BEST phone number to call the day of the visit and- I included this in appointment notes.  3. I asked if the patient had access to (through a family member/friend) a smartphone with video capability to be used for his visit?"  The patient said *yes -       , I documented "PHONE" in the appointment notes.  4. I confirmed consent by  a. sending through [place] or by email the [place] as written at the end of this message or  b. verbally as listed below. i. This visit is being performed in the setting of COVID-19. ii. All virtual visits are billed to your insurance company just like a normal visit would be.   iii. We'd like you to understand that the technology does not allow for your provider to perform an examination, and thus may limit your provider's ability to fully assess your condition.  iv. If your provider identifies any concerns that need to be evaluated in person, we will make arrangements to do so.   v. Finally, though the technology is pretty good, we cannot assure that it will always work on either your or our end, and in the setting of a video visit, we may have to convert it to a phone-only visit.  In  either situation, we cannot ensure that we have a secure connection.   vi. Are you willing to proceed?"  STAFF: Did the patient verbally acknowledge consent to telehealth visit? Document YES/NO here: YES  2. I advised the patient to be prepared - I asked that the patient, on the day of his visit, record any information possible with the equipment at his home, such as blood pressure, pulse, oxygen saturation, and your weight and write them all down. I asked the patient to have a pen and paper handy nearby the day of the visit as well.  3. If the patient was scheduled for a video visit, I informed the patient that the visit with the doctor would start with a text to the smartphone # given to [name] by the patient.         If the patient was scheduled for a telephone call, I informed the patient that the visit with the doctor would start with a call to the telephone # given to [name] by the patient.  4. I Informed patient they will receive a phone call 15 minutes prior to their appointment time from a [place] or nurse to review medications, allergies, etc. to prepare for the visit.    TELEPHONE CALL NOTE  Justin Browning has been deemed a candidate for a follow-up  tele-health visit to limit community exposure during the Covid-19 pandemic. I spoke with the patient via phone to ensure availability of phone/video source, confirm preferred email & phone number, and discuss instructions and expectations.  I reminded Justin Browning to be prepared with any vital sign and/or heart rhythm information that could potentially be obtained via home monitoring, at the time of his visit. I reminded Justin Browning to expect a phone call prior to his visit.  Justin Browning, NT 06/30/2018 3:54 PM     FULL LENGTH CONSENT FOR TELE-HEALTH VISIT   I hereby voluntarily request, consent and authorize [facility] and its employed or contracted physicians, physician assistants, nurse practitioners or other licensed health care  professionals (the Practitioner), to provide me with telemedicine health care services (the "Services") as deemed necessary by the treating Practitioner. I acknowledge and consent to receive the Services by the Practitioner via telemedicine. I understand that the telemedicine visit will involve communicating with the Practitioner through live audiovisual communication technology and the disclosure of certain medical information by electronic transmission. I acknowledge that I have been given the opportunity to request an in-person assessment or other available alternative prior to the telemedicine visit and am voluntarily participating in the telemedicine visit.  I understand that I have the right to withhold or withdraw my consent to the use of telemedicine in the course of my care at any time, without affecting my right to future care or treatment, and that the Practitioner or I may terminate the telemedicine visit at any time. I understand that I have the right to inspect all information obtained and/or recorded in the course of the telemedicine visit and may receive copies of available information for a reasonable fee.  I understand that some of the potential risks of receiving the Services via telemedicine include:  [name] Delay or interruption in medical evaluation due to technological equipment failure or disruption; . Information transmitted may not be sufficient (e.g. poor resolution of images) to allow for appropriate medical decision making by the Practitioner; and/or  . In rare instances, security protocols could fail, causing a breach of personal health information.  Furthermore, I acknowledge that it is my responsibility to provide information about my medical history, conditions and care that is complete and accurate to the best of my ability. I acknowledge that Practitioner's advice, recommendations, and/or decision may be based on factors not within their control, such as incomplete or inaccurate  data provided by me or distortions of diagnostic images or specimens that may result from electronic transmissions. I understand that the practice of medicine is not an exact science and that Practitioner makes no warranties or guarantees regarding treatment outcomes. I acknowledge that I will receive a copy of this consent concurrently upon execution via email to the email address I last provided but may also request a printed copy by calling the office of [facility].    I understand that my insurance will be billed for this visit.   I have read or had this consent read to me. . I understand the contents of this consent, which adequately explains the benefits and risks of the Services being provided via telemedicine.  . I have been provided ample opportunity to ask questions regarding this consent and the Services and have had my questions answered to my satisfaction. . I give my informed consent for the services to be provided through the use of telemedicine in my medical care  By participating in this telemedicine visit I agree  to the above.

## 2018-07-01 ENCOUNTER — Other Ambulatory Visit: Payer: Self-pay

## 2018-07-01 ENCOUNTER — Encounter: Payer: Self-pay | Admitting: Vascular Surgery

## 2018-07-01 ENCOUNTER — Ambulatory Visit (INDEPENDENT_AMBULATORY_CARE_PROVIDER_SITE_OTHER): Payer: Medicare Other | Admitting: Vascular Surgery

## 2018-07-01 VITALS — BP 102/85

## 2018-07-01 DIAGNOSIS — I723 Aneurysm of iliac artery: Secondary | ICD-10-CM | POA: Diagnosis not present

## 2018-07-01 DIAGNOSIS — I714 Abdominal aortic aneurysm, without rupture, unspecified: Secondary | ICD-10-CM

## 2018-07-01 LAB — HM DIABETES EYE EXAM

## 2018-07-01 NOTE — Progress Notes (Signed)
Virtual Visit via Telephone Note    I connected with Justin Browning on 07/01/2018 using the [employer] by telephone and verified that I was speaking with the correct person using two identifiers. Patient was located at home and accompanied by his wife. I am located at [facility].   The limitations of evaluation and management by telemedicine and the availability of in person appointments have been previously discussed with the patient and are documented in the patients chart. The patient expressed understanding and consented to proceed.  PCP: Trixie Dredge, PA-C   Chief Complaint: Abdominal aortic and iliac artery aneurysms  History of Present Illness: Justin Browning is a 73 y.o. male with known abdominal aortic and iliac aneurysms found on lumbar CT.  He underwent ultrasound follow-up in our office on 06/26/2018 and I am discussing with [name] today by telephone due to [place]  Past Medical History:  Diagnosis Date  . Abdominal aortic aneurysm (AAA) without rupture ([place]) 01/31/2017  . Anemia of chronic disease 10/11/2016  . Aneurysm ([place])    multiple   . Atrial flutter ([place]) 09/23/2014  . CAD (coronary artery disease) 10/11/2016   Overview:  Non STEMI 1/06 Cardiac Catheterization 1/06-Total occlusion of the LAD,OM1,LCX,RPDA CABG 02/15/04(LIMA-LAD,SVG-OM1,SVG-OM2-OM3) Postoperative course complicated by recurrent ventricular and atrial arrhythmias.Acute abdomen requiring exploratory laparotomy, partial resection of the rectosigmoid colon, diverting colostomy s/p revision. AICD 2/06  . Chronic atrial fibrillation 10/11/2016  . Chronic congestive heart failure ([place]) 10/11/2016  . Diabetes mellitus without complication ([place])   . Dyspnea on exertion 05/13/2015  . ESRD (end stage renal disease) on dialysis ([place])   . ESRD on dialysis ([place]) 10/11/2016  . Hernia of abdominal cavity   . History of adenocarcinoma of lung 10/11/2016   s/p partial lobectomy right lung  . Hyperlipidemia   .  Ischemic cardiomyopathy 10/09/2016  . Lumbar degenerative disc disease 10/11/2016  . Mixed hyperlipidemia 10/11/2016  . Sleep apnea   . Thrombocytopenia ([place]) 10/11/2016  . Type 2 diabetes mellitus with chronic kidney disease on chronic dialysis, without long-term current use of insulin ([place]) 10/11/2016  . VT (ventricular tachycardia) ([place]) 05/12/2010   Overview:  AICD 03/09/04 [employer]   Past Surgical History:  Procedure Laterality Date  . CARDIAC DEFIBRILLATOR PLACEMENT    . CARDIAC SURGERY     Quad Bypass  . COLON SURGERY    . CORONARY ARTERY BYPASS GRAFT    . CORONARY ARTERY BYPASS GRAFT    . [place]  . KNEE SURGERY Bilateral   . LUNG LOBECTOMY Right   . PARTIAL COLECTOMY    . REPLACEMENT TOTAL KNEE Left     Current Meds  Medication Sig  . amiodarone (PACERONE) 200 MG tablet Take 200 mg by mouth daily.  [name] aspirin 81 MG chewable tablet Chew by mouth daily.  [name] atorvastatin (LIPITOR) 20 MG tablet Take 1 tablet (20 mg total) by mouth daily. Due for labs  . carvedilol (COREG) 3.125 MG tablet Take 3.125 mg by mouth 2 (two) times daily with a meal.  . ELIQUIS 5 MG TABS tablet TAKE 1 TABLET BY MOUTH TWICE DAILY  . gabapentin (NEURONTIN) 300 MG capsule Take 2 capsules (600 mg total) by mouth at bedtime.  . MULTIPLE VITAMIN PO Take by mouth.  . NONFORMULARY OR COMPOUNDED ITEM Home oxygen 3L per minute on nasal cannula  . OXYGEN Inhale 2 L into the lungs as needed.  . pantoprazole (PROTONIX) 20 MG tablet TAKE 2 TABLETS(40 MG) BY MOUTH  DAILY  . sertraline (ZOLOFT) 25 MG tablet Take 1 tablet (25 mg total) by mouth at bedtime.  . sevelamer carbonate (RENVELA) 800 MG tablet Take 800 mg by mouth 3 (three) times daily with meals.  . torsemide (DEMADEX) 100 MG tablet Take 50 mg by mouth. Take 1/2 tab PO on nondialysis days  . TRADJENTA 5 MG TABS tablet TAKE 1 TABLET BY MOUTH DAILY       Observations/Objective: Ultrasound from 06/26/2018 was reviewed.  This shows maximal  diameter of his aorta of 3.9 cm and maximal iliac diameter of 2.9 cm.  Assessment and Plan: I have recommended CT scan in 1 year.  I did review symptoms of leaking aneurysm and explained this would be very unlikely with his current size. Follow Up Instructions:   Follow up 1 year with CT scan   I discussed the assessment and treatment plan with the patient. The patient was provided an opportunity to ask questions and all were answered. The patient agreed with the plan and demonstrated an understanding of the instructions.   The patient was advised to call back or seek an in-person evaluation if the symptoms worsen or if the condition fails to improve as anticipated.  I spent 5-10 minutes with the patient via telephone encounter.   Annamary Rummage [facility] Office: [phone]  07/01/2018, 9:57 AM

## 2018-07-03 ENCOUNTER — Telehealth: Payer: Self-pay | Admitting: Physician Assistant

## 2018-07-03 ENCOUNTER — Telehealth: Payer: Self-pay

## 2018-07-03 ENCOUNTER — Encounter: Payer: Self-pay | Admitting: Physician Assistant

## 2018-07-03 ENCOUNTER — Ambulatory Visit (INDEPENDENT_AMBULATORY_CARE_PROVIDER_SITE_OTHER): Payer: Medicare Other | Admitting: Physician Assistant

## 2018-07-03 VITALS — BP 108/70 | Wt 250.0 lb

## 2018-07-03 DIAGNOSIS — R29898 Other symptoms and signs involving the musculoskeletal system: Secondary | ICD-10-CM

## 2018-07-03 DIAGNOSIS — G63 Polyneuropathy in diseases classified elsewhere: Secondary | ICD-10-CM

## 2018-07-03 DIAGNOSIS — M19011 Primary osteoarthritis, right shoulder: Secondary | ICD-10-CM

## 2018-07-03 DIAGNOSIS — F329 Major depressive disorder, single episode, unspecified: Secondary | ICD-10-CM | POA: Insufficient documentation

## 2018-07-03 DIAGNOSIS — F418 Other specified anxiety disorders: Secondary | ICD-10-CM | POA: Insufficient documentation

## 2018-07-03 DIAGNOSIS — F419 Anxiety disorder, unspecified: Secondary | ICD-10-CM | POA: Insufficient documentation

## 2018-07-03 DIAGNOSIS — F411 Generalized anxiety disorder: Secondary | ICD-10-CM | POA: Insufficient documentation

## 2018-07-03 DIAGNOSIS — F5105 Insomnia due to other mental disorder: Secondary | ICD-10-CM | POA: Insufficient documentation

## 2018-07-03 DIAGNOSIS — F32A Depression, unspecified: Secondary | ICD-10-CM | POA: Insufficient documentation

## 2018-07-03 DIAGNOSIS — F341 Dysthymic disorder: Secondary | ICD-10-CM | POA: Diagnosis not present

## 2018-07-03 DIAGNOSIS — E782 Mixed hyperlipidemia: Secondary | ICD-10-CM | POA: Diagnosis not present

## 2018-07-03 DIAGNOSIS — I25119 Atherosclerotic heart disease of native coronary artery with unspecified angina pectoris: Secondary | ICD-10-CM | POA: Insufficient documentation

## 2018-07-03 DIAGNOSIS — M545 Low back pain, unspecified: Secondary | ICD-10-CM

## 2018-07-03 DIAGNOSIS — R5381 Other malaise: Secondary | ICD-10-CM

## 2018-07-03 DIAGNOSIS — G8929 Other chronic pain: Secondary | ICD-10-CM

## 2018-07-03 MED ORDER — ATORVASTATIN CALCIUM 20 MG PO TABS: 20.0000 mg | ORAL_TABLET | Freq: Every day | ORAL | 0 refills | Status: DC

## 2018-07-03 MED ORDER — SERTRALINE HCL 25 MG PO TABS: 25.0000 mg | ORAL_TABLET | Freq: Every day | ORAL | 0 refills | Status: DC

## 2018-07-03 NOTE — Progress Notes (Signed)
Virtual Visit via Telephone Note  I connected with Justin Browning on 07/03/18 at  4:00 PM EDT by telephone and verified that I am speaking with the correct person using two identifiers.   I discussed the limitations, risks, security and privacy concerns of performing an evaluation and management service by telephone and the availability of in person appointments. I also discussed with the patient that there may be a patient responsible charge related to this service. The patient expressed understanding and agreed to proceed.   History of Present Illness: HPI:                                                                Justin Browning is a 73 y.o. male PMH of CHF, CAD, Afib, OSA, ESRD on dialysis, DM2, neuropaty  CC: sleep issues  06/17/2018 Reports falling asleep and waking up about an hour later He sleeps either on the recliner or on the couch while watching tv Reports this has been a "minor problem" for many years. For the last 3 nights he states it has been terrible and he reports he was disoriented and did not know where he was. Reports falling asleep watching tv and sometimes will feel like he is in the film or becomes a part of the program he is watching.  Mood - "I am a little depressed" Anxiety - Reports he is "stressed about everything"  07/03/2018 He was started on Sertraline 25 mg QHS and Gabapentin was increased from 300 mg to 600 mg nightly Sleep is slightly improved Has not noticed any side effects from the Sertraline He states he started wearing his home O2 at night and he thinks this is also helping his sleep Sleeps is still very fragmented. He will doze for approx 2-2.5 hrs at a time, wake up and watch tv in between. In total he is sleeping 6-8 hours including 1 daytime nap. He does not have excessive daytime sleepiness  Depression screen [facility] 2/9 07/03/2018 08/05/2017 05/14/2017 03/21/2017 10/11/2016  Decreased Interest 1 1 1 3 3   Down, Depressed, Hopeless 1 3 3 3 3   PHQ - 2  Score 2 4 4 6 6   Altered sleeping 2 3 3 3 3   Tired, decreased energy 0 3 3 3 3   Change in appetite 0 2 0 0 0  Feeling bad or failure about yourself  1 3 3 3 3   Trouble concentrating 2 0 0 2 3  Moving slowly or fidgety/restless 0 0 0 0 2  Suicidal thoughts 0 1 2 3  -  PHQ-9 Score 7 16 15 20 20   Difficult doing work/chores - Somewhat difficult - Extremely dIfficult -    GAD 7 : Generalized Anxiety Score 07/03/2018 08/05/2017 05/14/2017 03/21/2017  Nervous, Anxious, on Edge 3 0 3 2  Control/stop worrying 3 2 3 2   Worry too much - different things 3 2 3 2   Trouble relaxing 2 0 3 3  Restless 0 0 0 1  Easily annoyed or irritable 1 3 3 3   Afraid - awful might happen 0 2 3 3   Total GAD 7 Score 12 9 18 16   Anxiety Difficulty - Somewhat difficult - Extremely difficult      Past Medical History:  Diagnosis Date  . Abdominal aortic aneurysm (AAA)  without rupture ([place]) 01/31/2017  . Anemia of chronic disease 10/11/2016  . Aneurysm ([place])    multiple   . Atrial flutter ([place]) 09/23/2014  . CAD (coronary artery disease) 10/11/2016   Overview:  Non STEMI 1/06 Cardiac Catheterization 1/06-Total occlusion of the LAD,OM1,LCX,RPDA CABG 02/15/04(LIMA-LAD,SVG-OM1,SVG-OM2-OM3) Postoperative course complicated by recurrent ventricular and atrial arrhythmias.Acute abdomen requiring exploratory laparotomy, partial resection of the rectosigmoid colon, diverting colostomy s/p revision. AICD 2/06  . Chronic atrial fibrillation 10/11/2016  . Chronic congestive heart failure ([place]) 10/11/2016  . Diabetes mellitus without complication ([place])   . Dyspnea on exertion 05/13/2015  . ESRD (end stage renal disease) on dialysis ([place])   . ESRD on dialysis ([place]) 10/11/2016  . Hernia of abdominal cavity   . History of adenocarcinoma of lung 10/11/2016   s/p partial lobectomy right lung  . Hyperlipidemia   . Ischemic cardiomyopathy 10/09/2016  . Lumbar degenerative disc disease 10/11/2016  . Mixed hyperlipidemia 10/11/2016  . Sleep  apnea   . Thrombocytopenia ([place]) 10/11/2016  . Type 2 diabetes mellitus with chronic kidney disease on chronic dialysis, without long-term current use of insulin ([place]) 10/11/2016  . VT (ventricular tachycardia) ([place]) 05/12/2010   Overview:  AICD 03/09/04 [employer]   Past Surgical History:  Procedure Laterality Date  . CARDIAC DEFIBRILLATOR PLACEMENT    . CARDIAC SURGERY     [place] Bypass  . COLON SURGERY    . CORONARY ARTERY BYPASS GRAFT    . CORONARY ARTERY BYPASS GRAFT    . [place]  . KNEE SURGERY Bilateral   . LUNG LOBECTOMY Right   . PARTIAL COLECTOMY    . REPLACEMENT TOTAL KNEE Left    Social History   Tobacco Use  . Smoking status: Former Smoker    Packs/day: 3.00    Years: 40.00    Pack years: 120.00    Types: Cigarettes    Quit date: 08/20/2004    Years since quitting: 13.8  . Smokeless tobacco: Never Used  Substance Use Topics  . Alcohol use: No   family history includes Cancer in his father; Dementia in his mother; Diabetes in his mother; Stroke in his mother.    ROS: negative except as noted in the HPI  Medications: Current Outpatient Medications  Medication Sig Dispense Refill  . amiodarone (PACERONE) 200 MG tablet Take 200 mg by mouth daily.    [name] aspirin 81 MG chewable tablet Chew by mouth daily.    [name] atorvastatin (LIPITOR) 20 MG tablet Take 1 tablet (20 mg total) by mouth daily. Due for labs 30 tablet 0  . carvedilol (COREG) 3.125 MG tablet Take 3.125 mg by mouth 2 (two) times daily with a meal.    . ELIQUIS 5 MG TABS tablet TAKE 1 TABLET BY MOUTH TWICE DAILY 180 tablet 0  . gabapentin (NEURONTIN) 300 MG capsule Take 2 capsules (600 mg total) by mouth at bedtime. 180 capsule 1  . MULTIPLE VITAMIN PO Take by mouth.    . NONFORMULARY OR COMPOUNDED ITEM Home oxygen 3L per minute on nasal cannula 1 each 0  . OXYGEN Inhale 2 L into the lungs as needed.    . pantoprazole (PROTONIX) 20 MG tablet TAKE 2 TABLETS(40 MG) BY MOUTH DAILY 180 tablet 0  .  sertraline (ZOLOFT) 25 MG tablet Take 1 tablet (25 mg total) by mouth at bedtime. 30 tablet 1  . sevelamer carbonate (RENVELA) 800 MG tablet Take 800 mg by mouth 3 (three) times daily with meals.    [name]  torsemide (DEMADEX) 100 MG tablet Take 50 mg by mouth. Take 1/2 tab PO on nondialysis days    . TRADJENTA 5 MG TABS tablet TAKE 1 TABLET BY MOUTH DAILY 90 tablet 1   No current facility-administered medications for this visit.    No Known Allergies     Objective:  Wt 250 lb (113.4 kg)   BMI 32.10 kg/m  Neuro: alert and oriented x 3 Psych: cooperative, euthymic mood, affect mood-congruent, speech is articulate, normal rate and volume; thought processes clear and goal-directed, normal judgment, good insight     Assessment and Plan: 73 y.o. male with   .Justin Browning was seen today for follow-up.  Diagnoses and all orders for this visit:  Major depression, chronic  Mixed hyperlipidemia -     atorvastatin (LIPITOR) 20 MG tablet; Take 1 tablet (20 mg total) by mouth daily. Due for labs  Coronary artery disease involving native heart with angina pectoris, unspecified vessel or lesion type (HCC) -     atorvastatin (LIPITOR) 20 MG tablet; Take 1 tablet (20 mg total) by mouth daily. Due for labs  GAD (generalized anxiety disorder) -     sertraline (ZOLOFT) 25 MG tablet; Take 1 tablet (25 mg total) by mouth at bedtime.   Chronic Depression, GAD PHQ9=7, improved from 16, no acute safety issues GAD7=12, worsened from 9 Anxiety and depression complicated by chronic health issues Cont Sertraline 25 mg QHS and Gabapentin 600 mg QHS Encouraged to continue wearing oxygen at bedtime   Follow Up Instructions:    I discussed the assessment and treatment plan with the patient. The patient was provided an opportunity to ask questions and all were answered. The patient agreed with the plan and demonstrated an understanding of the instructions.   The patient was advised to call back or seek an  in-person evaluation if the symptoms worsen or if the condition fails to improve as anticipated.  I provided 5-10 minutes of non-face-to-face time during this encounter.   Trixie Dredge, [place]

## 2018-07-03 NOTE — Telephone Encounter (Signed)
Wife is questioning home health referral that was placed about 6 weeks.  -EH/RMA

## 2018-07-03 NOTE — Telephone Encounter (Deleted)
Can you contact patient's nephrologist (Nwo[place], Lyman Bishop, MD, [facility]) and ask them to check a lipid panel next time they are getting labs on [place]? This is not urgent Fax result to our office  Also can you please abstract most recent labs in [place] dated 06/18/2018 and 07/02/2018

## 2018-07-08 ENCOUNTER — Ambulatory Visit (INDEPENDENT_AMBULATORY_CARE_PROVIDER_SITE_OTHER): Payer: Medicare Other

## 2018-07-08 ENCOUNTER — Other Ambulatory Visit: Payer: Self-pay

## 2018-07-08 ENCOUNTER — Encounter: Payer: Self-pay | Admitting: Physician Assistant

## 2018-07-08 ENCOUNTER — Ambulatory Visit (INDEPENDENT_AMBULATORY_CARE_PROVIDER_SITE_OTHER): Payer: Medicare Other | Admitting: Physician Assistant

## 2018-07-08 VITALS — BP 112/64 | HR 70 | Temp 97.6°F | Resp 18 | Wt 256.0 lb

## 2018-07-08 DIAGNOSIS — J9611 Chronic respiratory failure with hypoxia: Secondary | ICD-10-CM

## 2018-07-08 DIAGNOSIS — N390 Urinary tract infection, site not specified: Secondary | ICD-10-CM | POA: Diagnosis not present

## 2018-07-08 DIAGNOSIS — R319 Hematuria, unspecified: Secondary | ICD-10-CM

## 2018-07-08 DIAGNOSIS — R82998 Other abnormal findings in urine: Secondary | ICD-10-CM

## 2018-07-08 DIAGNOSIS — I25119 Atherosclerotic heart disease of native coronary artery with unspecified angina pectoris: Secondary | ICD-10-CM | POA: Diagnosis not present

## 2018-07-08 DIAGNOSIS — R0902 Hypoxemia: Secondary | ICD-10-CM

## 2018-07-08 DIAGNOSIS — Z95810 Presence of automatic (implantable) cardiac defibrillator: Secondary | ICD-10-CM

## 2018-07-08 DIAGNOSIS — R441 Visual hallucinations: Secondary | ICD-10-CM

## 2018-07-08 DIAGNOSIS — Z9981 Dependence on supplemental oxygen: Secondary | ICD-10-CM

## 2018-07-08 DIAGNOSIS — I5043 Acute on chronic combined systolic (congestive) and diastolic (congestive) heart failure: Secondary | ICD-10-CM | POA: Diagnosis not present

## 2018-07-08 DIAGNOSIS — F05 Delirium due to known physiological condition: Secondary | ICD-10-CM | POA: Diagnosis not present

## 2018-07-08 LAB — POCT URINALYSIS DIPSTICK
Bilirubin, UA: NEGATIVE
Glucose, UA: NEGATIVE
Ketones, UA: NEGATIVE
Nitrite, UA: NEGATIVE
Protein, UA: POSITIVE — AB
Spec Grav, UA: 1.015 (ref 1.010–1.025)
Urobilinogen, UA: 0.2 E.U./dL
pH, UA: 7 (ref 5.0–8.0)

## 2018-07-08 LAB — CBC WITH DIFFERENTIAL/PLATELET
Absolute Monocytes: 672 cells/uL (ref 200–950)
Basophils Absolute: 60 cells/uL (ref 0–200)
Basophils Relative: 1 %
Eosinophils Absolute: 210 cells/uL (ref 15–500)
Eosinophils Relative: 3.5 %
HCT: 36.4 % — ABNORMAL LOW (ref 38.5–50.0)
Hemoglobin: 11.8 g/dL — ABNORMAL LOW (ref 13.2–17.1)
Lymphs Abs: 678 cells/uL — ABNORMAL LOW (ref 850–3900)
MCH: 29.5 pg (ref 27.0–33.0)
MCHC: 32.4 g/dL (ref 32.0–36.0)
MCV: 91 fL (ref 80.0–100.0)
MPV: 11.2 fL (ref 7.5–12.5)
Monocytes Relative: 11.2 %
Neutro Abs: 4380 cells/uL (ref 1500–7800)
Neutrophils Relative %: 73 %
Platelets: 122 10*3/uL — ABNORMAL LOW (ref 140–400)
RBC: 4 10*6/uL — ABNORMAL LOW (ref 4.20–5.80)
RDW: 16.5 % — ABNORMAL HIGH (ref 11.0–15.0)
Total Lymphocyte: 11.3 %
WBC: 6 10*3/uL (ref 3.8–10.8)

## 2018-07-08 LAB — BRAIN NATRIURETIC PEPTIDE: Brain Natriuretic Peptide: 1290 pg/mL — ABNORMAL HIGH (ref <100)

## 2018-07-08 MED ORDER — CIPROFLOXACIN HCL 500 MG PO TABS: 500.0000 mg | ORAL_TABLET | Freq: Every day | ORAL | 0 refills | Status: DC

## 2018-07-08 MED ORDER — CEFTRIAXONE SODIUM 500 MG IJ SOLR: 1000.0000 mg | Freq: Once | INTRAMUSCULAR | Status: DC

## 2018-07-08 MED ORDER — CIPROFLOXACIN HCL 500 MG PO TABS: 500.0000 mg | ORAL_TABLET | Freq: Every day | ORAL | 0 refills | Status: AC

## 2018-07-08 MED ORDER — CEFTRIAXONE SODIUM 1 G IJ SOLR
1.0000 g | Freq: Once | INTRAMUSCULAR | Status: AC
  Administered 2018-07-08: 17:00:00 1 g via INTRAMUSCULAR

## 2018-07-08 NOTE — Patient Instructions (Addendum)
Delirium  Delirium is a state of mental confusion. It comes on quickly and causes significant changes in a person's thinking and behavior. People with delirium usually have trouble paying attention to what is going on or knowing where they are. They may become very withdrawn or very emotional and unable to sit still. They may even see or feel things that are not there (hallucinations). Delirium is a sign of a serious underlying medical condition.  What are the causes?  Delirium occurs when something suddenly affects the signals that the brain sends out. Brain signals can be affected by anything that puts severe stress on the body and brain and causes brain chemicals to be out of balance. The most common causes of delirium include:  Infections. These may be bacterial, viral, fungal, or protozoal.  Medicines. These include many over-the-counter and prescription medicines.  Recreational drugs.  Substance withdrawal. This occurs with sudden discontinuation of alcohol, certain medicines, or recreational drugs.  Surgery.  Sudden vascular events, such as stroke, brain hemorrhage, and severe migraine.  Other brain disorders, such as tumors, seizures, and physical head trauma.  Metabolic disorders, such as kidney or liver failure.  Low blood oxygen (anoxia). This may occur with lung disease, cardiac arrest, or carbon monoxide poisoning.  Hormone imbalances (endocrinopathies), such as an overactive thyroid (hyperthyroidism) or underactive thyroid (hypothyroidism).  Vitamin deficiencies.  What increases the risk?  This condition is more likely to develop in:  Children.  Older people.  People who live alone.  People who have vision loss or hearing loss.  People who have existing brain disease, such as dementia.  People who have long-lasting (chronic) medical conditions, such as heart disease.  People who are hospitalized for long periods of time.  What are the signs or symptoms?  Delirium starts with a sudden change in a  person's thinking or behavior. Symptoms come and go (fluctuate) over time, and they are often worse at the end of the day. Symptoms include:  Not being able to stay awake (drowsiness) or pay attention.  Being confused about places, time, and people.  Forgetfulness.  Having extreme energy levels. These may be low or high.  Changes in sleep patterns.  Extreme mood swings, such as anger or anxiety.  Focusing on things or ideas that are not important.  Rambling and senseless talking.  Difficulty speaking, understanding speech, or both.  Hallucinations.  Tremor or unsteady gait.  How is this diagnosed?  People with delirium may not realize that they have the condition. Often, a family member or health care provider is the first person to notice the changes. The health care provider will obtain a detailed history of current symptoms, medical issues, medicines, and recreational drug use. The health care provider will perform a mental status examination by:  Asking questions to check for confusion.  Watching for abnormal behavior.  The health care provider may perform a physical exam and order lab tests or additional studies to determine the cause of the delirium.  How is this treated?  Treatment of delirium depends on the cause and severity. Delirium usually goes away within days or weeks of treating the underlying cause. In the meantime, the person should not be left alone because he or she may accidentally cause self-harm. Treatment includes supportive care, such as:  Increased light during the day and decreased light at night.  Low noise level.  Uninterrupted sleep.  A regular daily schedule.  Clocks and calendars to help with orientation.  Familiar objects, including   the person's pictures and clothing.  Frequent visits from familiar family and friends.  Healthy diet.  Exercise.  In more severe cases of delirium, medicine may be prescribed to help the person to keep calm and think more clearly.  Follow these  instructions at home:  Any supportive care should be continued as told by the health care provider.  All medicines should be used as told by the health care provider. This is important.  The health care provider should be consulted before over-the-counter medicines, herbs, or supplements are used.  All follow-up visits should be kept as told by the health care provider. This is important.  Alcohol and recreational drugs should be avoided as told by the health care provider.  Contact a health care provider if:  Symptoms do not get better or they become worse.  New symptoms of delirium develop.  Caring for the person at home does not seem safe.  Eating, drinking, or communicating stops.  There are side effects of medicines, such as changes in sleep patterns, dizziness, weight gain, restlessness, movement changes, or tremors.  Get help right away if:  Serious thoughts occur about self-harm or about hurting others.  There are serious side effects of medicine, such as:  Swelling of the face, lips, tongue, or throat.  Fever, confusion, muscle spasms, or seizures.  This information is not intended to replace advice given to you by your health care provider. Make sure you discuss any questions you have with your health care provider.  Document Released: 09/26/2011 Document Revised: 06/09/2015 Document Reviewed: 02/24/2014  [employer]vier Interactive Patient Education  2019 [employer]
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
## 2018-07-10 ENCOUNTER — Encounter: Payer: Self-pay | Admitting: Physician Assistant

## 2018-07-10 ENCOUNTER — Ambulatory Visit (INDEPENDENT_AMBULATORY_CARE_PROVIDER_SITE_OTHER): Payer: Medicare Other | Admitting: Physician Assistant

## 2018-07-10 DIAGNOSIS — J9611 Chronic respiratory failure with hypoxia: Secondary | ICD-10-CM | POA: Diagnosis not present

## 2018-07-10 DIAGNOSIS — R441 Visual hallucinations: Secondary | ICD-10-CM | POA: Diagnosis not present

## 2018-07-10 DIAGNOSIS — R7989 Other specified abnormal findings of blood chemistry: Secondary | ICD-10-CM | POA: Diagnosis not present

## 2018-07-10 DIAGNOSIS — F05 Delirium due to known physiological condition: Secondary | ICD-10-CM | POA: Diagnosis not present

## 2018-07-10 DIAGNOSIS — Z9981 Dependence on supplemental oxygen: Secondary | ICD-10-CM

## 2018-07-10 LAB — URINE CULTURE
MICRO NUMBER:: 601143
Result:: NO GROWTH
SPECIMEN QUALITY:: ADEQUATE

## 2018-07-10 NOTE — Telephone Encounter (Signed)
Wife is stating that [facility] told her they never received the PT order. I explained that if [facility] is calling them to set up an appointment then they have the order Can you fax the referral one more time just in case?
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
## 2018-07-10 NOTE — Telephone Encounter (Signed)
I called Justin Browning from [facility] and he stated they called to set up time to start care and the wife kept pushing the date back due to Covid she did not want anyone in the house. So therefore home health was never able to get him set up. - CF

## 2018-07-15 NOTE — Telephone Encounter (Signed)
New referral placed.

## 2018-07-15 NOTE — Addendum Note (Signed)
Addended by: Nelson Chimes E on: 07/15/2018 04:02 PM   Modules accepted: Orders

## 2018-07-15 NOTE — Telephone Encounter (Signed)
I received a call from [facility] yesterday stating they need a new referral for Justin Browning the other one is now out of day. Please put referral in and they will get patient set up. - CF

## 2018-07-16 NOTE — Telephone Encounter (Signed)
I let [facility] know new order was in and patient is ready for scheduling. - CF

## 2018-07-20 ENCOUNTER — Encounter: Payer: Self-pay | Admitting: Physician Assistant

## 2018-08-10 ENCOUNTER — Other Ambulatory Visit: Payer: Self-pay | Admitting: Physician Assistant

## 2018-08-10 DIAGNOSIS — M5136 Other intervertebral disc degeneration, lumbar region: Secondary | ICD-10-CM

## 2018-08-10 DIAGNOSIS — F5105 Insomnia due to other mental disorder: Secondary | ICD-10-CM

## 2018-08-10 DIAGNOSIS — F419 Anxiety disorder, unspecified: Secondary | ICD-10-CM

## 2018-08-14 ENCOUNTER — Telehealth: Payer: Self-pay | Admitting: Physician Assistant

## 2018-08-14 NOTE — Telephone Encounter (Signed)
Patient wife was in the clinic and she was walking out and told PCP that her spouse thought he was having blood in his urine. A cup was given and patient wife will drop off at front desk tomorrow.

## 2018-08-15 ENCOUNTER — Telehealth: Payer: Self-pay | Admitting: Physician Assistant

## 2018-08-15 DIAGNOSIS — B9689 Other specified bacterial agents as the cause of diseases classified elsewhere: Secondary | ICD-10-CM

## 2018-08-15 DIAGNOSIS — Z79899 Other long term (current) drug therapy: Secondary | ICD-10-CM

## 2018-08-15 DIAGNOSIS — N390 Urinary tract infection, site not specified: Secondary | ICD-10-CM

## 2018-08-15 DIAGNOSIS — R319 Hematuria, unspecified: Secondary | ICD-10-CM

## 2018-08-15 NOTE — Telephone Encounter (Signed)
Labs entered and faxed to lab for urine patient dropped off. KG LPN

## 2018-08-19 LAB — URINALYSIS, DIPSTICK ONLY
Bilirubin, UA: NEGATIVE
Glucose, UA: NEGATIVE
Ketones, UA: NEGATIVE
Nitrite, UA: NEGATIVE
Specific Gravity, UA: 1.017 (ref 1.005–1.030)
Urobilinogen, Ur: 0.2 mg/dL (ref 0.2–1.0)
pH, UA: 5 (ref 5.0–7.5)

## 2018-08-19 LAB — SPECIMEN STATUS REPORT

## 2018-08-22 ENCOUNTER — Telehealth: Payer: Self-pay | Admitting: Physician Assistant

## 2018-08-22 DIAGNOSIS — B9689 Other specified bacterial agents as the cause of diseases classified elsewhere: Secondary | ICD-10-CM | POA: Insufficient documentation

## 2018-08-22 DIAGNOSIS — N390 Urinary tract infection, site not specified: Secondary | ICD-10-CM

## 2018-08-22 MED ORDER — CIPROFLOXACIN HCL 500 MG PO TABS: 500.0000 mg | ORAL_TABLET | ORAL | 0 refills | Status: AC

## 2018-08-22 NOTE — Telephone Encounter (Signed)
Called [facility] and paged Dr. Neta Ehlers at 2;49 pm Regarding patient's positive urine culture to coordinate treatment He has not yet responded to the page  If Dr. Neta Ehlers calls back, please find me in clinic to take the call or give my cell #

## 2018-08-22 NOTE — Telephone Encounter (Signed)
Tried calling cell phone and home phone numbers, no answer.

## 2018-08-22 NOTE — Telephone Encounter (Signed)
Tried to contact patient phone just rings. -EH/RMA

## 2018-08-22 NOTE — Telephone Encounter (Signed)
Urine culture positive for infection Can you please schedule patient for telephone visit at 2:40 today (slot is held)? Can you please contact patient's Nephrologist to arrange treatment at dialysis? Nwobu, Lyman Bishop, MD as Consulting Physician (Nephrology)

## 2018-08-22 NOTE — Telephone Encounter (Signed)
See other phone note. Levaquin was not sent. Cipro was sent.

## 2018-08-22 NOTE — Telephone Encounter (Signed)
Spoke to Dr. Neta Ehlers He recommended treatment with either Cipro or Levaquin every other day for 7 doses Avoiding Levaquin due to Amiodarone Rx for Cipro sent to pharmacy with ER precautions given in result note

## 2018-08-22 NOTE — Telephone Encounter (Signed)
I spoke to Dr. Neta Ehlers I have sent in Levaquin 500 mg - instruct patient to take 1 tab EVERY OTHER day (not daily) for 7 doses Nephrology will check on him on Monday If he is having any fever, chills, confusion, altered mentation, abdominal pain, nausea/vomiting he should go to the ER

## 2018-08-22 NOTE — Addendum Note (Signed)
Addended by: Nelson Chimes E on: 08/22/2018 03:06 PM   Modules accepted: Orders

## 2018-08-22 NOTE — Telephone Encounter (Signed)
Spoke with pt's wife regarding the treatment plan and ER precautions in result note.

## 2018-08-26 LAB — URINE CULTURE

## 2018-08-26 LAB — SPECIMEN STATUS REPORT

## 2018-08-30 ENCOUNTER — Other Ambulatory Visit: Payer: Self-pay | Admitting: Physician Assistant

## 2018-08-30 DIAGNOSIS — Z7901 Long term (current) use of anticoagulants: Secondary | ICD-10-CM

## 2018-08-30 DIAGNOSIS — I480 Paroxysmal atrial fibrillation: Secondary | ICD-10-CM

## 2018-09-12 ENCOUNTER — Other Ambulatory Visit: Payer: Self-pay | Admitting: Physician Assistant

## 2018-09-12 DIAGNOSIS — I255 Ischemic cardiomyopathy: Secondary | ICD-10-CM

## 2018-09-12 NOTE — Progress Notes (Signed)
Received copy of patient's recent echo showing severe LV dilation and severely reduced EF<25% I read most recent Cardiology note Per Dr. Mauricio Po, "think his cardiomyopathy is so severe that we cannot maintain a good blood pressure particularly for him to have his dialysis. Obviously he needs to have dialysis to survive. He recommended referral to [facility] to help manage his severe cardiomyopathy I do not see an appt scheduled, so I have placed an urgent referral  Med list updated to reflect changes 1. Stop Coreg 2. Increase midodrine to 10 mg 3 times daily

## 2018-10-01 ENCOUNTER — Other Ambulatory Visit: Payer: Self-pay | Admitting: Physician Assistant

## 2018-10-01 DIAGNOSIS — I25119 Atherosclerotic heart disease of native coronary artery with unspecified angina pectoris: Secondary | ICD-10-CM

## 2018-10-01 DIAGNOSIS — E782 Mixed hyperlipidemia: Secondary | ICD-10-CM

## 2018-10-02 ENCOUNTER — Ambulatory Visit: Payer: Medicare Other | Admitting: Physician Assistant

## 2018-10-02 MED ORDER — AMIODARONE HCL IV
200.00 | INTRAVENOUS | Status: DC
Start: 2018-10-02 — End: 2018-10-02

## 2018-10-02 MED ORDER — NUTREN RENAL PO LIQD
2400.00 | ORAL | Status: DC
Start: 2018-10-01 — End: 2018-10-02

## 2018-10-02 MED ORDER — OATMEAL BATH OILATED EX PACK
10.00 | PACK | CUTANEOUS | Status: DC
Start: ? — End: 2018-10-02

## 2018-10-02 MED ORDER — NEXTERONE IV
81.00 | INTRAVENOUS | Status: DC
Start: 2018-10-02 — End: 2018-10-02

## 2018-10-02 MED ORDER — CVS KIDPANT BOYS X-LARGE MISC
40.00 | Status: DC
Start: 2018-10-01 — End: 2018-10-02

## 2018-10-02 MED ORDER — Medication
50.00 | Status: DC
Start: 2018-10-02 — End: 2018-10-02

## 2018-10-02 MED ORDER — Medication
10.00 | Status: DC
Start: 2018-10-01 — End: 2018-10-02

## 2018-10-02 MED ORDER — Medication
Status: DC
Start: ? — End: 2018-10-02

## 2018-10-02 MED ORDER — PCCA BIOPEPTIDE BASE EX CREA
300.00 | TOPICAL_CREAM | CUTANEOUS | Status: DC
Start: 2018-10-01 — End: 2018-10-02

## 2018-10-02 MED ORDER — WEIGHT LOSS DAILY MULTI PO TABS
5.00 | ORAL_TABLET | ORAL | Status: DC
Start: 2018-10-01 — End: 2018-10-02

## 2018-10-02 MED ORDER — QUINERVA 260 MG PO TABS
650.00 | ORAL_TABLET | ORAL | Status: DC
Start: ? — End: 2018-10-02

## 2018-10-02 MED ORDER — GLUCOSAMINE-CHONDROIT-COLLAGEN PO
100.00 | ORAL | Status: DC
Start: 2018-10-01 — End: 2018-10-02

## 2018-10-02 MED ORDER — METHADOSE 5 MG PO TABS
4.00 | ORAL_TABLET | ORAL | Status: DC
Start: ? — End: 2018-10-02

## 2018-10-02 MED ORDER — ACCU-PRO PUMP SET/VENT MISC
2.50 | Status: DC
Start: ? — End: 2018-10-02

## 2018-10-02 MED ORDER — ALBUMIN HUMAN 25 % IV SOLN
12.50 | INTRAVENOUS | Status: DC
Start: ? — End: 2018-10-02

## 2018-10-02 MED ORDER — SODIUM CHLORIDE 0.9 % IV SOLN
150.00 | INTRAVENOUS | Status: DC
Start: ? — End: 2018-10-02

## 2018-10-02 MED ORDER — BARO-CAT PO
10.00 | ORAL | Status: DC
Start: ? — End: 2018-10-02

## 2018-10-02 MED ORDER — ZOTEX GPX PO
12.50 | ORAL | Status: DC
Start: ? — End: 2018-10-02

## 2018-10-02 MED ORDER — WAL-FEX 180 MG PO TABS
50.00 | ORAL_TABLET | ORAL | Status: DC
Start: ? — End: 2018-10-02

## 2018-10-02 MED ORDER — BAYER WOMENS 81-300 MG PO TABS
40.00 | ORAL_TABLET | ORAL | Status: DC
Start: 2018-10-01 — End: 2018-10-02

## 2018-10-10 ENCOUNTER — Inpatient Hospital Stay: Payer: Medicare Other | Admitting: Family Medicine

## 2018-10-15 ENCOUNTER — Other Ambulatory Visit: Payer: Self-pay | Admitting: Physician Assistant

## 2018-10-15 DIAGNOSIS — E782 Mixed hyperlipidemia: Secondary | ICD-10-CM

## 2018-10-15 DIAGNOSIS — I25119 Atherosclerotic heart disease of native coronary artery with unspecified angina pectoris: Secondary | ICD-10-CM

## 2018-10-21 ENCOUNTER — Other Ambulatory Visit: Payer: Self-pay

## 2018-10-21 ENCOUNTER — Emergency Department (INDEPENDENT_AMBULATORY_CARE_PROVIDER_SITE_OTHER)
Admission: EM | Admit: 2018-10-21 | Discharge: 2018-10-21 | Disposition: A | Discharge status: Home / Self Care | Payer: Medicare Other | Source: Home / Self Care | Attending: Emergency Medicine | Admitting: Emergency Medicine

## 2018-10-21 ENCOUNTER — Encounter: Payer: Self-pay | Admitting: Emergency Medicine

## 2018-10-21 ENCOUNTER — Telehealth: Payer: Self-pay

## 2018-10-21 DIAGNOSIS — M7989 Other specified soft tissue disorders: Secondary | ICD-10-CM | POA: Diagnosis not present

## 2018-10-21 DIAGNOSIS — I953 Hypotension of hemodialysis: Secondary | ICD-10-CM

## 2018-10-21 DIAGNOSIS — I5022 Chronic systolic (congestive) heart failure: Secondary | ICD-10-CM

## 2018-10-21 DIAGNOSIS — N186 End stage renal disease: Secondary | ICD-10-CM

## 2018-10-21 LAB — POCT FASTING CBG KUC MANUAL ENTRY: POCT Glucose (KUC): 150 mg/dL — AB (ref 70–99)

## 2018-10-21 MED ORDER — GENERIC EXTERNAL MEDICATION
2.00 | Status: DC
Start: 2018-10-22 — End: 2018-10-21

## 2018-10-21 MED ORDER — HYDROXYZINE HCL 25 MG PO TABS
25.00 | ORAL_TABLET | ORAL | Status: DC
Start: ? — End: 2018-10-21

## 2018-10-21 MED ORDER — HYDROCORTISONE 1 % EX CREA
TOPICAL_CREAM | CUTANEOUS | Status: DC
Start: 2018-10-22 — End: 2018-10-21

## 2018-10-21 NOTE — ED Provider Notes (Signed)
[facility]    CSN: [id number] Arrival date & time: 10/21/18  1231      History   Chief Complaint Chief Complaint  Patient presents with  . Leg Swelling    HPI Justin Browning is a 73 y.o. male.   HPI Patient enters with a history of chronic atrial fibrillation, congestive heart failure, diabetes, and end-stage renal disease on dialysis.  He has had progressive swelling in his left leg.  He has episodes when he cannot move his legs which last for approximately 2 hours.  He has had multiple episodes of near syncope.  He currently has a history of atrial fibrillation\ atrial flutter on anticoagulation.  He is due for dialysis tomorrow.  He has had significant swelling in his legs and has been treated for cellulitis with cephalexin and is not currently on that medication. Past Medical History:  Diagnosis Date  . Abdominal aortic aneurysm (AAA) without rupture ([place]) 01/31/2017  . Anemia of chronic disease 10/11/2016  . Aneurysm ([place])    multiple   . Atrial flutter ([place]) 09/23/2014  . CAD (coronary artery disease) 10/11/2016   Overview:  Non STEMI 1/06 Cardiac Catheterization 1/06-Total occlusion of the LAD,OM1,LCX,RPDA CABG 02/15/04(LIMA-LAD,SVG-OM1,SVG-OM2-OM3) Postoperative course complicated by recurrent ventricular and atrial arrhythmias.Acute abdomen requiring exploratory laparotomy, partial resection of the rectosigmoid colon, diverting colostomy s/p revision. AICD 2/06  . Chronic atrial fibrillation ([place]) 10/11/2016  . Chronic congestive heart failure ([place]) 10/11/2016  . Diabetes mellitus without complication ([place])   . Dyspnea on exertion 05/13/2015  . ESRD (end stage renal disease) on dialysis ([place])   . ESRD on dialysis ([place]) 10/11/2016  . Hernia of abdominal cavity   . History of adenocarcinoma of lung 10/11/2016   s/p partial lobectomy right lung  . Hyperlipidemia   . Ischemic cardiomyopathy 10/09/2016  . Lumbar degenerative disc disease 10/11/2016  . Mixed  hyperlipidemia 10/11/2016  . Sleep apnea   . Thrombocytopenia ([place]) 10/11/2016  . Type 2 diabetes mellitus with chronic kidney disease on chronic dialysis, without long-term current use of insulin ([place]) 10/11/2016  . VT (ventricular tachycardia) ([place]) 05/12/2010   Overview:  AICD 03/09/04 [employer]    Patient Active Problem List   Diagnosis Date Noted  . Urinary tract infection with hematuria 08/22/2018  . Urinary tract infection due to Klebsiella species 08/22/2018  . Delirium due to medical condition with behavioral disturbance 07/08/2018  . Major depression, chronic 07/03/2018  . GAD (generalized anxiety disorder) 07/03/2018  . Coronary artery disease involving native heart with angina pectoris ([place]) 07/03/2018  . Dysthymia 07/03/2018  . Anxiety about health 07/03/2018  . Insomnia secondary to anxiety 07/03/2018  . Anxiety and depression 07/03/2018  . Skin lesion of back 05/13/2018  . Polyneuropathy associated with underlying disease ([place]) 03/25/2018  . Hemodialysis-associated hypotension 03/20/2018  . Mild peripheral edema 03/20/2018  . Primary osteoarthritis of both shoulders 08/20/2017  . Acute on chronic combined systolic and diastolic congestive heart failure ([place]) 08/06/2017  . Chronic respiratory failure with hypoxia, on home O2 therapy ([place]) 08/05/2017  . Pulmonary nodules 08/05/2017  . Pleural effusion on right 05/16/2017  . Restrictive lung disease 05/14/2017  . History of pneumonia 03/29/2017  . Chronic cough 03/29/2017  . Iliac artery aneurysm, bilateral ([place]) 02/24/2017  . Osteoarthritis of multiple joints 02/08/2017  . Requires continuous at home supplemental oxygen 02/08/2017  . Encounter for long-term (current) use of high-risk medication 01/31/2017  . Abdominal aortic aneurysm (AAA) without rupture ([place]) 01/31/2017  . History of  adenocarcinoma of lung 10/11/2016  . ESRD on dialysis ([place]) 10/11/2016  . Paroxysmal A-fib ([place]) 10/11/2016  . Type 2 diabetes  mellitus with chronic kidney disease on chronic dialysis, without long-term current use of insulin ([place]) 10/11/2016  . Mixed hyperlipidemia 10/11/2016  . Artificial cardiac pacemaker 10/11/2016  . Coronary artery disease involving native coronary artery of native heart without angina pectoris 10/11/2016  . Anemia of chronic disease 10/11/2016  . Thrombocytopenia ([place]) 10/11/2016  . AICD (automatic cardioverter/defibrillator) present 10/11/2016  . Chronic congestive heart failure ([place]) 10/11/2016  . Hx of CABG 10/11/2016  . Non-gaseous abdominal distention 10/11/2016  . Enlarged prostate 10/11/2016  . Lumbar degenerative disc disease 10/11/2016  . Ischemic cardiomyopathy 10/09/2016  . Chest pain 06/19/2016  . Dyspnea on exertion 05/13/2015  . Long term current use of anticoagulants with INR goal of 2.0-3.0 05/13/2015  . CKD (chronic kidney disease) stage 5, GFR less than 15 ml/min (HCC) 01/13/2014    Past Surgical History:  Procedure Laterality Date  . CARDIAC DEFIBRILLATOR PLACEMENT    . CARDIAC SURGERY     Quad Bypass  . COLON SURGERY    . CORONARY ARTERY BYPASS GRAFT    . CORONARY ARTERY BYPASS GRAFT    . [place]  . KNEE SURGERY Bilateral   . LUNG LOBECTOMY Right   . PARTIAL COLECTOMY    . REPLACEMENT TOTAL KNEE Left        Home Medications    Prior to Admission medications   Medication Sig Start Date End Date Taking? Authorizing Provider  amiodarone (PACERONE) 200 MG tablet Take 200 mg by mouth daily.    [provider]  aspirin 81 MG chewable tablet Chew by mouth daily.    [provider]  atorvastatin (LIPITOR) 20 MG tablet Take 1 tablet (20 mg total) by mouth daily. Due for labs 10/01/18   Trixie Dredge, PA-C  ELIQUIS 5 MG TABS tablet TAKE 1 TABLET BY MOUTH TWICE DAILY 09/01/18   Trixie Dredge, PA-C  gabapentin (NEURONTIN) 300 MG capsule TAKE ONE CAPSULE BY MOUTH DAILY 08/11/18   Trixie Dredge, PA-C   midodrine (PROAMATINE) 10 MG tablet Take by mouth. 08/28/18   [provider]  MULTIPLE VITAMIN PO Take by mouth.    [provider]  NONFORMULARY OR COMPOUNDED ITEM Home oxygen 3L per minute on nasal cannula 02/08/17   Trixie Dredge, PA-C  OXYGEN Inhale 2 L into the lungs as needed.    [provider]  pantoprazole (PROTONIX) 20 MG tablet TAKE 2 TABLETS(40 MG) BY MOUTH DAILY 09/09/17   Trixie Dredge, PA-C  sevelamer carbonate (RENVELA) 800 MG tablet Take 800 mg by mouth 3 (three) times daily with meals.    [provider]  torsemide (DEMADEX) 100 MG tablet Take 50 mg by mouth. Take 1/2 tab PO on nondialysis days    [provider]  TRADJENTA 5 MG TABS tablet TAKE 1 TABLET BY MOUTH DAILY 03/24/18   Trixie Dredge, PA-C    Family History Family History  Problem Relation Age of Onset  . Dementia Mother   . Diabetes Mother   . Stroke Mother   . Cancer Father        colon    Social History Social History   Tobacco Use  . Smoking status: Former Smoker    Packs/day: 3.00    Years: 40.00    Pack years: 120.00    Types: Cigarettes    Quit  date: 08/20/2004    Years since quitting: 14.1  . Smokeless tobacco: Never Used  Substance Use Topics  . Alcohol use: No  . Drug use: No     Allergies   Sertraline   Review of Systems Review of Systems  Constitutional: Positive for fatigue.  HENT: Negative.   Respiratory:       Patient is on chronic oxygen therapy 2 L.  Cardiovascular:       He has a history of atrial fib/flutter.  He is currently on anticoagulation.  He has severe congestive heart failure.  He has not had any true chest pain but has episodes of being lightheaded and feeling as if he will faint.  This occurs frequently and happen yesterday after dialysis.  Endocrine:       Patient is diabetic and does not check his sugars at home  Musculoskeletal:       He is currently trying to have physical  therapy at home.  He is unable to walk but a few steps.  He feels he will collapse.  At times he goes 2 to 3 hours and he is unable to move his legs.     Physical Exam Triage Vital Signs ED Triage Vitals  Enc Vitals Group     BP 10/21/18 1255 (!) 90/52     Pulse Rate 10/21/18 1255 81     Resp --      Temp 10/21/18 1255 99 F (37.2 C)     Temp Source 10/21/18 1255 Oral     SpO2 10/21/18 1255 (!) 80 %     Weight 10/21/18 1257 251 lb (113.9 kg)     Height 10/21/18 1257 6\' 2"  (1.88 m)     Head Circumference --      Peak Flow --      Pain Score 10/21/18 1257 0     Pain Loc --      Pain Edu? --      Excl. in [place]? --    No data found.  Updated Vital Signs BP (!) 90/52 (BP Location: Left Arm)   Pulse 81   Temp 99 F (37.2 C) (Oral)   Ht 6\' 2"  (1.88 m)   Wt 113.9 kg   SpO2 (!) 80%   BMI 32.23 kg/m   Visual Acuity Right Eye Distance:   Left Eye Distance:   Bilateral Distance:    Right Eye Near:   Left Eye Near:    Bilateral Near:     Physical Exam Musculoskeletal:     Comments: There is nail avulsion to the right fourth toe dressing was changed      UC Treatments / Results  Labs (all labs ordered are listed, but only abnormal results are displayed) Labs Reviewed  POCT FASTING CBG [facility] MANUAL ENTRY - Abnormal; Notable for the following components:      Result Value   POCT Glucose ([facility]) 150 (*)    All other components within normal limits    EKG   Radiology No results found.  Procedures Procedures (including critical care time)  Medications Ordered in UC Medications - No data to display  Initial Impression / Assessment and Plan / UC Course  I have reviewed the triage vital signs and the nursing notes. This patient has severe cardiac and pulmonary disease.  He has end-stage renal disease on dialysis.  His blood pressure was 90 when seen here.  He is currently on Midodrin  to help keep his pressure stable. Stable.  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  He also has significant swelling of the left calf compared to the right.  I do not think this is a true cellulitis he would benefit from an ultrasound of the left leg even though he is currently on anticoagulation for his cardiac disease.  Because of these multiple comorbidities he was transported to the hospital for further evaluation and management.  He was reluctant to go but I told him he should get stabilized at the hospital and then should work with his PCP regarding skilled nursing as well as physical therapy to assist if he wants to remain in the home. Pertinent labs & imaging results that were available during my care of the patient were reviewed by me and considered in my medical decision making (see chart for details).      Final Clinical Impressions(s) / UC Diagnoses   Final diagnoses:  Leg swelling  End stage renal disease (HCC)  Hemodialysis-associated hypotension  Chronic systolic congestive heart failure [facility])     Discharge Instructions     You are being transported to the hospital for their evaluation. Decision will be made whether to stay on antibiotics regarding your left leg swelling. They will consider doing a left leg Doppler while at the hospital. Please contact your primary care physician upon discharge so appropriate follow-up can be performed.    ED Prescriptions    None     PDMP not reviewed this encounter.   Darlyne Russian, MD 10/21/18 1440

## 2018-10-21 NOTE — Telephone Encounter (Signed)
I couldn't say for sure without seeing him or at least a photo of the issue. Would trust the home health nurse to do a basic assessment and contact me if they are concerned, or pt can schedule an appt.

## 2018-10-21 NOTE — Discharge Instructions (Signed)
You are being transported to the hospital for their evaluation. Decision will be made whether to stay on antibiotics regarding your left leg swelling. They will consider doing a left leg Doppler while at the hospital. Please contact your primary care physician upon discharge so appropriate follow-up can be performed.

## 2018-10-21 NOTE — Telephone Encounter (Signed)
As per pt's wife Justin Browning - home health nurse was concern with the appearance of pt's lower extremity. Pt was taken to UC. As per recommendation from UC provider - pt was transferred to [place]  Is still awaiting to hear from hospital provider about pt's condition. She request that provider keeps an eye on pt's chart for incoming results. No other inquiries during call.

## 2018-10-21 NOTE — Telephone Encounter (Signed)
Pt's wife called stating that pt has some redness around his ankles going up to his calves. She said there was no warmness or swelling. No other symptoms or complaints. Pt is due to have a nurse home visit with [facility] between 1 pm - 3 pm today. Requesting recommendation from provider. Pls advise, thanks.

## 2018-10-21 NOTE — ED Triage Notes (Signed)
Feet and legs swelling and red, denies pain, feels itchy x 3 weeks just finished keflex for in grown toe nail

## 2018-10-22 ENCOUNTER — Telehealth: Payer: Self-pay | Admitting: Osteopathic Medicine

## 2018-10-22 NOTE — Telephone Encounter (Signed)
Patient was seen at [place] for cellulitis in left leg. Patient needs a hospital follow up. Nothing open in an appropriate time slot. Please advise.

## 2018-10-22 NOTE — Telephone Encounter (Signed)
Appointment has been made. No further questions at this time.  

## 2018-10-22 NOTE — Telephone Encounter (Signed)
Can put it in any acute slot try not to have back to back w/ new or physical appt

## 2018-10-24 ENCOUNTER — Inpatient Hospital Stay: Payer: Medicare Other | Admitting: Osteopathic Medicine

## 2018-10-26 MED ORDER — ALBUMIN HUMAN 25 % IV SOLN
12.50 | INTRAVENOUS | Status: DC
Start: ? — End: 2018-10-26

## 2018-10-26 MED ORDER — ALTEPLASE 2 MG IJ SOLR
2.00 | INTRAMUSCULAR | Status: DC
Start: ? — End: 2018-10-26

## 2018-10-26 MED ORDER — SODIUM CHLORIDE 0.9 % IV SOLN
10.00 | INTRAVENOUS | Status: DC
Start: ? — End: 2018-10-26

## 2018-10-26 MED ORDER — ALBUTEROL SULFATE (2.5 MG/3ML) 0.083% IN NEBU
2.50 | INHALATION_SOLUTION | RESPIRATORY_TRACT | Status: DC
Start: ? — End: 2018-10-26

## 2018-10-26 MED ORDER — AMIODARONE HCL 200 MG PO TABS
200.00 | ORAL_TABLET | ORAL | Status: DC
Start: 2018-10-26 — End: 2018-10-26

## 2018-10-26 MED ORDER — HYDROCORTISONE 1 % EX CREA
TOPICAL_CREAM | CUTANEOUS | Status: DC
Start: 2018-10-25 — End: 2018-10-26

## 2018-10-26 MED ORDER — GABAPENTIN 300 MG PO CAPS
300.00 | ORAL_CAPSULE | ORAL | Status: DC
Start: 2018-10-25 — End: 2018-10-26

## 2018-10-26 MED ORDER — GENERIC EXTERNAL MEDICATION
Status: DC
Start: ? — End: 2018-10-26

## 2018-10-26 MED ORDER — ATORVASTATIN CALCIUM 20 MG PO TABS
20.00 | ORAL_TABLET | ORAL | Status: DC
Start: 2018-10-25 — End: 2018-10-26

## 2018-10-26 MED ORDER — SODIUM CHLORIDE 0.9 % IV SOLN
150.00 | INTRAVENOUS | Status: DC
Start: ? — End: 2018-10-26

## 2018-10-26 MED ORDER — GENERIC EXTERNAL MEDICATION
50.00 | Status: DC
Start: 2018-10-26 — End: 2018-10-26

## 2018-10-26 MED ORDER — DIPHENHYDRAMINE HCL 50 MG/ML IJ SOLN
12.50 | INTRAMUSCULAR | Status: DC
Start: ? — End: 2018-10-26

## 2018-10-26 MED ORDER — HYDROXYZINE HCL 25 MG PO TABS
25.00 | ORAL_TABLET | ORAL | Status: DC
Start: ? — End: 2018-10-26

## 2018-10-26 MED ORDER — SEVELAMER CARBONATE 800 MG PO TABS
2400.00 | ORAL_TABLET | ORAL | Status: DC
Start: 2018-10-25 — End: 2018-10-26

## 2018-10-26 MED ORDER — ASPIRIN EC 81 MG PO TBEC
81.00 | DELAYED_RELEASE_TABLET | ORAL | Status: DC
Start: 2018-10-26 — End: 2018-10-26

## 2018-10-26 MED ORDER — SODIUM CHLORIDE (PF) 0.9 % IJ SOLN
50.00 | INTRAMUSCULAR | Status: DC
Start: ? — End: 2018-10-26

## 2018-10-26 MED ORDER — MIDODRINE HCL 5 MG PO TABS
10.00 | ORAL_TABLET | ORAL | Status: DC
Start: 2018-10-25 — End: 2018-10-26

## 2018-10-26 MED ORDER — CEPHALEXIN 250 MG PO CAPS
250.00 | ORAL_CAPSULE | ORAL | Status: DC
Start: 2018-10-25 — End: 2018-10-26

## 2018-10-26 MED ORDER — NEPHRO-VITE 0.8 MG PO TABS
1.00 | ORAL_TABLET | ORAL | Status: DC
Start: 2018-10-26 — End: 2018-10-26

## 2018-10-26 MED ORDER — NITROGLYCERIN 0.4 MG SL SUBL
0.40 | SUBLINGUAL_TABLET | SUBLINGUAL | Status: DC
Start: ? — End: 2018-10-26

## 2018-10-26 MED ORDER — ALBUTEROL SULFATE (2.5 MG/3ML) 0.083% IN NEBU
2.50 | INHALATION_SOLUTION | RESPIRATORY_TRACT | Status: DC
Start: 2018-10-25 — End: 2018-10-26

## 2018-10-26 MED ORDER — MUPIROCIN 2 % EX OINT
TOPICAL_OINTMENT | CUTANEOUS | Status: DC
Start: 2018-10-25 — End: 2018-10-26

## 2018-10-26 MED ORDER — DOXYCYCLINE MONOHYDRATE 100 MG PO CAPS
100.00 | ORAL_CAPSULE | ORAL | Status: DC
Start: 2018-10-25 — End: 2018-10-26

## 2018-10-26 MED ORDER — PANTOPRAZOLE SODIUM 40 MG PO TBEC
40.00 | DELAYED_RELEASE_TABLET | ORAL | Status: DC
Start: 2018-10-25 — End: 2018-10-26

## 2018-10-26 MED ORDER — ONDANSETRON HCL 4 MG/2ML IJ SOLN
4.00 | INTRAMUSCULAR | Status: DC
Start: ? — End: 2018-10-26

## 2018-10-26 MED ORDER — APIXABAN 5 MG PO TABS
5.00 | ORAL_TABLET | ORAL | Status: DC
Start: 2018-10-25 — End: 2018-10-26

## 2018-10-30 ENCOUNTER — Other Ambulatory Visit: Payer: Self-pay

## 2018-10-30 ENCOUNTER — Ambulatory Visit (INDEPENDENT_AMBULATORY_CARE_PROVIDER_SITE_OTHER): Payer: Medicare Other | Admitting: Osteopathic Medicine

## 2018-10-30 VITALS — BP 94/59 | HR 77 | Ht 73.0 in | Wt 252.0 lb

## 2018-10-30 DIAGNOSIS — Z23 Encounter for immunization: Secondary | ICD-10-CM

## 2018-10-30 DIAGNOSIS — N186 End stage renal disease: Secondary | ICD-10-CM

## 2018-10-30 DIAGNOSIS — Z992 Dependence on renal dialysis: Secondary | ICD-10-CM | POA: Diagnosis not present

## 2018-10-30 DIAGNOSIS — E1122 Type 2 diabetes mellitus with diabetic chronic kidney disease: Secondary | ICD-10-CM

## 2018-10-30 DIAGNOSIS — I255 Ischemic cardiomyopathy: Secondary | ICD-10-CM

## 2018-10-30 DIAGNOSIS — L03115 Cellulitis of right lower limb: Secondary | ICD-10-CM | POA: Diagnosis not present

## 2018-10-30 LAB — POCT GLYCOSYLATED HEMOGLOBIN (HGB A1C): Hemoglobin A1C: 5.6 % (ref 4.0–5.6)

## 2018-10-30 MED ORDER — ALBUTEROL SULFATE HFA 108 (90 BASE) MCG/ACT IN AERS: 2.0000 | INHALATION_SPRAY | Freq: Four times a day (QID) | RESPIRATORY_TRACT | 11 refills | Status: AC | PRN

## 2018-10-30 MED ORDER — PANTOPRAZOLE SODIUM 40 MG PO TBEC: 40.0000 mg | DELAYED_RELEASE_TABLET | Freq: Every day | ORAL | 3 refills | Status: AC

## 2018-10-30 MED ORDER — SEVELAMER CARBONATE 800 MG PO TABS: 800.0000 mg | ORAL_TABLET | Freq: Four times a day (QID) | ORAL | 1 refills | Status: AC

## 2018-10-30 MED ORDER — HYDROXYZINE HCL 50 MG PO TABS: 50.0000 mg | ORAL_TABLET | Freq: Every evening | ORAL | 3 refills | Status: AC | PRN

## 2018-10-30 MED ORDER — MUPIROCIN 2 % EX OINT: 1.0000 "application " | TOPICAL_OINTMENT | Freq: Three times a day (TID) | CUTANEOUS | 3 refills | Status: AC

## 2018-10-30 MED ORDER — IRON 325 (65 FE) MG PO TABS: 1.0000 | ORAL_TABLET | Freq: Every day | ORAL | 3 refills | Status: AC

## 2018-10-30 MED ORDER — HYDROCORTISONE ACETATE 1 % EX CREA: 1.0000 "application " | TOPICAL_CREAM | Freq: Three times a day (TID) | CUTANEOUS | 1 refills | Status: AC | PRN

## 2018-10-30 MED ORDER — VITAMIN D 25 MCG (1000 UNIT) PO TABS: 1000.0000 [IU] | ORAL_TABLET | Freq: Every day | ORAL | 3 refills | Status: AC

## 2018-10-30 NOTE — Progress Notes (Signed)
HPI: Justin Browning is a 73 y.o. male who  has a past medical history of Abdominal aortic aneurysm (AAA) without rupture ([place]) (01/31/2017), Anemia of chronic disease (10/11/2016), Aneurysm ([place]), Atrial flutter ([place]) (09/23/2014), CAD (coronary artery disease) (10/11/2016), Chronic atrial fibrillation ([place]) (10/11/2016), Chronic congestive heart failure ([place]) (10/11/2016), Diabetes mellitus without complication ([place]), Dyspnea on exertion (05/13/2015), ESRD (end stage renal disease) on dialysis [facility]), ESRD on dialysis ([place]) (10/11/2016), Hernia of abdominal cavity, History of adenocarcinoma of lung (10/11/2016), Hyperlipidemia, Ischemic cardiomyopathy (10/09/2016), Lumbar degenerative disc disease (10/11/2016), Mixed hyperlipidemia (10/11/2016), Sleep apnea, Thrombocytopenia ([place]) (10/11/2016), Type 2 diabetes mellitus with chronic kidney disease on chronic dialysis, without long-term current use of insulin ([place]) (10/11/2016), and VT (ventricular tachycardia) ([place]) (05/12/2010).  he presents to [facility], 10/30/18,  for chief complaint of: hospital follow up  Cellulitis Patient was admitted to hospital on 10/23/18 for bilateral LE swelling and redness diagnosed cellulitis. Discharged on Keflex and Doxycycline which he reports he has been taking consistently. Today, he is feeling well and reports the redness, swelling, and pruritis has improved. He denies worsening leg pain and swelling. Denies chest pain, fevers, worsening SOB.   Patient also had 4th toenail fall off on right foot while in hospital and wants to know if he needs to continue Bactroban and hydrocortisone.    Patient is accompanied by wife, Justin Browning, who assists with history-taking.    At today's visit 10/30/18 ... PMH, PSH, FH reviewed and updated as needed.  Current medication list and allergy/intolerance hx reviewed and updated as needed. (See remainder of HPI, ROS, Phys Exam below)   No results  found.  Results for orders placed or performed in visit on 10/30/18 (from the past 72 hour(s))  POCT glycosylated hemoglobin (Hb A1C)     Status: Normal   Collection Time: 10/30/18  9:48 AM  Result Value Ref Range   Hemoglobin A1C 5.6 4.0 - 5.6 %   HbA1c POC (<> result, manual entry)     HbA1c, POC (prediabetic range)     HbA1c, POC (controlled diabetic range)            ASSESSMENT/PLAN: The primary encounter diagnosis was Cellulitis of right lower extremity. Diagnoses of Type 2 diabetes mellitus with chronic kidney disease on chronic dialysis, without long-term current use of insulin ([place]) and Flu vaccine need were also pertinent to this visit.   Justin Browning is a 73 y.o. male here for a follow up of a recent hospital admission of cellulitis. Patient is doing well today since being discharged, has continued Keflex and Doxycycline and denies any worsening of cellulitis. Will refill bactroban to put on toe nial bed, ok to use hydrocortisone prn for itching   Updated medication list since discharge   Orders Placed This Encounter  Procedures  . Flu Vaccine QUAD High Dose(Fluad)  . POCT glycosylated hemoglobin (Hb A1C)     Meds ordered this encounter  Medications  . pantoprazole (PROTONIX) 40 MG tablet    Sig: Take 1 tablet (40 mg total) by mouth daily.    Dispense:  90 tablet    Refill:  3    **Patient requests 90 days supply**  . sevelamer carbonate (RENVELA) 800 MG tablet    Sig: Take 1 tablet (800 mg total) by mouth 4 (four) times daily.    Dispense:  360 tablet    Refill:  1  . Ferrous Sulfate (IRON) 325 (65 Fe) MG TABS    Sig: Take 1  tablet (325 mg total) by mouth daily.    Dispense:  90 tablet    Refill:  3  . cholecalciferol (VITAMIN D3) 25 MCG (1000 UT) tablet    Sig: Take 1 tablet (1,000 Units total) by mouth daily.    Dispense:  90 tablet    Refill:  3  . Hydrocortisone Acetate 1 % CREA    Sig: Apply 1 application topically 3 (three) times daily as needed  (itching).    Dispense:  28 g    Refill:  1  . hydrOXYzine (ATARAX/VISTARIL) 50 MG tablet    Sig: Take 1 tablet (50 mg total) by mouth at bedtime and may repeat dose one time if needed. For itching.    Dispense:  30 tablet    Refill:  3  . mupirocin ointment (BACTROBAN) 2 %    Sig: Apply 1 application topically 3 (three) times daily. Apply to affected area for 7-10 days.    Dispense:  30 g    Refill:  3  . albuterol (VENTOLIN HFA) 108 (90 Base) MCG/ACT inhaler    Sig: Inhale 2 puffs into the lungs every 6 (six) hours as needed for wheezing.    Dispense:  18 g    Refill:  11    Substitution permitted for any albuterol inhaler        Follow-up plan: Return in about 3 months (around 01/30/2019) for routine check-up, virtual visit ok if desired. See me sooner if needed! .                                                 ################################################# ################################################# ################################################# #################################################    No outpatient medications have been marked as taking for the 10/30/18 encounter (Appointment) with Emeterio Reeve, DO.    Allergies  Allergen Reactions  . Sertraline Other (See Comments)    Acute delirium       Review of Systems:  Constitutional: No fever  Cardiac: No  chest pain, No  pressure, No palpitations. LE edema with no increased swelling.   Respiratory:  No  shortness of breath. Regular Cough  Gastrointestinal: No  abdominal pain, no change on bowel habits  Musculoskeletal: No new myalgia/arthralgia  Skin: see HPI  Neurologic: No  weakness, No  Dizziness  Exam:  BP (!) 94/59   Pulse 77   Ht 6\' 1"  (1.854 m)   Wt 252 lb (114.3 kg)   SpO2 92%   BMI 33.25 kg/m   Constitutional: VS see above. General Appearance: alert, non-toxic appearing, NAD  Eyes: Normal lids and conjunctive,  non-icteric sclera  Respiratory: Normal respiratory effort on oxygen. no wheeze, no rhonchi  Cardiovascular: S1/S2 normal, no murmur, no rub/gallop auscultated. RRR. B/L LE pitting edema.   Musculoskeletal: Symmetric and independent movement of all extremities. Right pedal edema with 4th toenail removed.   Abdominal: Distended, non-tender.  Neurological: Normal balance/coordination. Mild tremor in left hand.   Skin: cool, dry, erythematous with scattered excoriations on bilateral LEs. Cooler at toes on R, no gangrene/cyanosis   Psychiatric: Normal judgment/insight. Normal mood and affect. Oriented x3.       Visit summary with medication list and pertinent instructions was printed for patient to review, patient was advised to alert [name] if any updates are needed. All questions at time of visit were answered - patient instructed to contact office with  any additional concerns. ER/RTC precautions were reviewed with the patient and understanding verbalized.    Please note: voice recognition software was used to produce this document, and typos may escape review. Please contact Dr. Sheppard Coil for any needed clarifications.    Follow up plan: Return in about 3 months (around 01/30/2019) for routine check-up, virtual visit ok if desired. See me sooner if needed! [name]

## 2018-11-11 ENCOUNTER — Other Ambulatory Visit: Payer: Self-pay

## 2018-11-11 DIAGNOSIS — I25119 Atherosclerotic heart disease of native coronary artery with unspecified angina pectoris: Secondary | ICD-10-CM

## 2018-11-11 DIAGNOSIS — E782 Mixed hyperlipidemia: Secondary | ICD-10-CM

## 2018-11-11 MED ORDER — ATORVASTATIN CALCIUM 20 MG PO TABS: 20.0000 mg | ORAL_TABLET | Freq: Every day | ORAL | 3 refills | Status: AC

## 2018-11-18 ENCOUNTER — Telehealth: Payer: Self-pay

## 2018-11-18 NOTE — Telephone Encounter (Signed)
Noted. If patient calls [name] about this issue, please alert referral coordinator

## 2018-11-18 NOTE — Telephone Encounter (Signed)
Received call from [place] w/ [facility] stating that she has made several attempts to contact patient to set up home health but has been unable to reach him. Izora Gala states that orders are now being discontinued due to not being able to reach patient/not getting a call back  Nancy's CB# [phone]   FYI to PCP

## 2018-11-19 NOTE — Telephone Encounter (Signed)
Note to referral coordinator as Juluis Rainier

## 2018-11-27 ENCOUNTER — Other Ambulatory Visit: Payer: Self-pay | Admitting: Physician Assistant

## 2018-11-27 DIAGNOSIS — Z7901 Long term (current) use of anticoagulants: Secondary | ICD-10-CM

## 2018-11-27 DIAGNOSIS — I480 Paroxysmal atrial fibrillation: Secondary | ICD-10-CM

## 2018-12-17 ENCOUNTER — Other Ambulatory Visit: Payer: Self-pay | Admitting: Neurology

## 2018-12-17 DIAGNOSIS — M5136 Other intervertebral disc degeneration, lumbar region: Secondary | ICD-10-CM

## 2018-12-17 DIAGNOSIS — F419 Anxiety disorder, unspecified: Secondary | ICD-10-CM

## 2018-12-17 DIAGNOSIS — F5105 Insomnia due to other mental disorder: Secondary | ICD-10-CM

## 2018-12-17 MED ORDER — GABAPENTIN 300 MG PO CAPS: 300.0000 mg | ORAL_CAPSULE | Freq: Every day | ORAL | 0 refills | Status: AC

## 2018-12-22 MED ORDER — BARO-CAT PO
10.00 | ORAL | Status: DC
Start: ? — End: 2018-12-22

## 2018-12-22 MED ORDER — Medication
1.00 | Status: DC
Start: ? — End: 2018-12-22

## 2018-12-22 MED ORDER — APIXABAN 5 MG PO TABS
5.00 | ORAL_TABLET | ORAL | Status: DC
Start: 2018-12-21 — End: 2018-12-22

## 2018-12-22 MED ORDER — PHENOL EX
60.00 | CUTANEOUS | Status: DC
Start: 2018-12-21 — End: 2018-12-22

## 2018-12-22 MED ORDER — BAYER WOMENS 81-300 MG PO TABS
40.00 | ORAL_TABLET | ORAL | Status: DC
Start: 2018-12-22 — End: 2018-12-22

## 2018-12-22 MED ORDER — NUTREN RENAL PO LIQD
3200.00 | ORAL | Status: DC
Start: 2018-12-21 — End: 2018-12-22

## 2018-12-22 MED ORDER — FENTANYL CITRATE (PF) 100 MCG/2ML IJ SOLN
25.00 | INTRAMUSCULAR | Status: DC
Start: ? — End: 2018-12-22

## 2018-12-22 MED ORDER — PITUITARY 35 MG PO TABS
1.00 | ORAL_TABLET | ORAL | Status: DC
Start: 2018-12-22 — End: 2018-12-22

## 2018-12-22 MED ORDER — ETRAFON 2-10 2-10 MG OR TABS
650.00 | ORAL_TABLET | ORAL | Status: DC
Start: ? — End: 2018-12-22

## 2018-12-22 MED ORDER — Medication
Status: DC
Start: ? — End: 2018-12-22

## 2018-12-22 MED ORDER — AMIODARONE HCL IV
200.00 | INTRAVENOUS | Status: DC
Start: 2018-12-22 — End: 2018-12-22

## 2018-12-22 MED ORDER — PCCA BIOPEPTIDE BASE EX CREA
300.00 | TOPICAL_CREAM | CUTANEOUS | Status: DC
Start: 2018-12-21 — End: 2018-12-22

## 2018-12-22 MED ORDER — TROJAN ULTRA TEXTURE LUBRICATE MISC
3.13 | Status: DC
Start: 2018-12-21 — End: 2018-12-22

## 2018-12-22 MED ORDER — QUINERVA 260 MG PO TABS
650.00 | ORAL_TABLET | ORAL | Status: DC
Start: ? — End: 2018-12-22

## 2018-12-22 MED ORDER — Medication
10.00 | Status: DC
Start: 2018-12-21 — End: 2018-12-22

## 2018-12-22 MED ORDER — PHENYLEPH-POT GUAIACOLSULF
81.00 | Status: DC
Start: 2018-12-22 — End: 2018-12-22

## 2018-12-22 MED ORDER — SOTRADECOL 1 % IV SOLN
325.00 | INTRAVENOUS | Status: DC
Start: 2018-12-22 — End: 2018-12-22

## 2018-12-22 MED ORDER — MUPIROCIN 2 % EX OINT
TOPICAL_OINTMENT | CUTANEOUS | Status: DC
Start: 2018-12-21 — End: 2018-12-22

## 2018-12-22 MED ORDER — ACCU-PRO PUMP SET/VENT MISC
2.50 | Status: DC
Start: ? — End: 2018-12-22

## 2018-12-22 MED ORDER — APAP PO
20.00 | ORAL | Status: DC
Start: 2018-12-21 — End: 2018-12-22

## 2018-12-22 MED ORDER — TYLENOL WITH CODEINE #3 300-30 MG PO TABS
1.00 | ORAL_TABLET | ORAL | Status: DC
Start: ? — End: 2018-12-22

## 2018-12-24 MED ORDER — ARMOUR THYROID 30 MG PO TABS
2.00 | ORAL_TABLET | ORAL | Status: DC
Start: ? — End: 2018-12-24

## 2018-12-24 MED ORDER — RA GLUCOSAMINE-CHONDROITIN DS 500-400 MG PO TABS
300.00 | ORAL_TABLET | ORAL | Status: DC
Start: 2018-12-24 — End: 2018-12-24

## 2018-12-24 MED ORDER — APAP PO
20.00 | ORAL | Status: DC
Start: 2018-12-24 — End: 2018-12-24

## 2018-12-24 MED ORDER — ACCU-PRO PUMP SET/VENT MISC
2.50 | Status: DC
Start: ? — End: 2018-12-24

## 2018-12-24 MED ORDER — EQUATE NICOTINE 4 MG MT GUM
4.00 | CHEWING_GUM | OROMUCOSAL | Status: DC
Start: ? — End: 2018-12-24

## 2018-12-24 MED ORDER — ALBUMIN HUMAN 25 % IV SOLN
12.50 | INTRAVENOUS | Status: DC
Start: ? — End: 2018-12-24

## 2018-12-24 MED ORDER — QUINERVA 260 MG PO TABS
650.00 | ORAL_TABLET | ORAL | Status: DC
Start: ? — End: 2018-12-24

## 2018-12-24 MED ORDER — ETRAFON 2-10 2-10 MG OR TABS
650.00 | ORAL_TABLET | ORAL | Status: DC
Start: ? — End: 2018-12-24

## 2018-12-24 MED ORDER — SB BRONCHIAL 12.5-200 MG PO TABS
12.50 | ORAL_TABLET | ORAL | Status: DC
Start: ? — End: 2018-12-24

## 2018-12-24 MED ORDER — BARO-CAT PO
10.00 | ORAL | Status: DC
Start: ? — End: 2018-12-24

## 2018-12-24 MED ORDER — Medication
10.00 | Status: DC
Start: 2018-12-24 — End: 2018-12-24

## 2018-12-24 MED ORDER — TYLENOL WITH CODEINE #3 300-30 MG PO TABS
1.00 | ORAL_TABLET | ORAL | Status: DC
Start: ? — End: 2018-12-24

## 2018-12-24 MED ORDER — ZOTEX GPX PO
12.50 | ORAL | Status: DC
Start: ? — End: 2018-12-24

## 2018-12-24 MED ORDER — PITUITARY 35 MG PO TABS
1.00 | ORAL_TABLET | ORAL | Status: DC
Start: 2018-12-25 — End: 2018-12-24

## 2018-12-24 MED ORDER — TRONOLANE 0.25 % RE SUPP
5.00 | RECTAL | Status: DC
Start: 2018-12-24 — End: 2018-12-24

## 2018-12-24 MED ORDER — NADOLOL 20 MG PO TABS
2.00 | ORAL_TABLET | ORAL | Status: DC
Start: ? — End: 2018-12-24

## 2018-12-24 MED ORDER — SOTRADECOL 1 % IV SOLN
325.00 | INTRAVENOUS | Status: DC
Start: 2018-12-25 — End: 2018-12-24

## 2018-12-24 MED ORDER — BAYER WOMENS 81-300 MG PO TABS
40.00 | ORAL_TABLET | ORAL | Status: DC
Start: 2018-12-26 — End: 2018-12-24

## 2018-12-24 MED ORDER — BARO-CAT PO
150.00 | ORAL | Status: DC
Start: ? — End: 2018-12-24

## 2018-12-24 MED ORDER — Medication
1.00 | Status: DC
Start: ? — End: 2018-12-24

## 2018-12-24 MED ORDER — TROJAN ULTRA TEXTURE LUBRICATE MISC
3.13 | Status: DC
Start: 2018-12-24 — End: 2018-12-24

## 2018-12-24 MED ORDER — NUTREN RENAL PO LIQD
3200.00 | ORAL | Status: DC
Start: 2018-12-24 — End: 2018-12-24

## 2018-12-24 MED ORDER — PHENYLEPH-POT GUAIACOLSULF
81.00 | Status: DC
Start: 2018-12-25 — End: 2018-12-24

## 2018-12-24 MED ORDER — WAL-FEX 180 MG PO TABS
50.00 | ORAL_TABLET | ORAL | Status: DC
Start: ? — End: 2018-12-24

## 2018-12-24 MED ORDER — Medication
Status: DC
Start: ? — End: 2018-12-24

## 2018-12-24 MED ORDER — AMIODARONE HCL IV
200.00 | INTRAVENOUS | Status: DC
Start: 2018-12-25 — End: 2018-12-24

## 2018-12-24 MED ORDER — Medication
50.00 | Status: DC
Start: 2018-12-25 — End: 2018-12-24

## 2019-01-16 DEATH — deceased

## 2019-02-03 ENCOUNTER — Ambulatory Visit: Payer: Medicare Other | Admitting: Osteopathic Medicine

## 2019-07-09 IMAGING — DX DG CHEST 2V
2 series · 2 of 2 positions shown · non-contrast
Comparison: None.

ADDENDUM:
Per ordering clinician, patient is status post RIGHT lower lobectomy
for lung cancer. Small RIGHT pleural effusion, likely without
subpulmonic component.
CLINICAL DATA: Cough, dizziness and weakness.

EXAM:
CHEST - 2 VIEW

[chest pa]
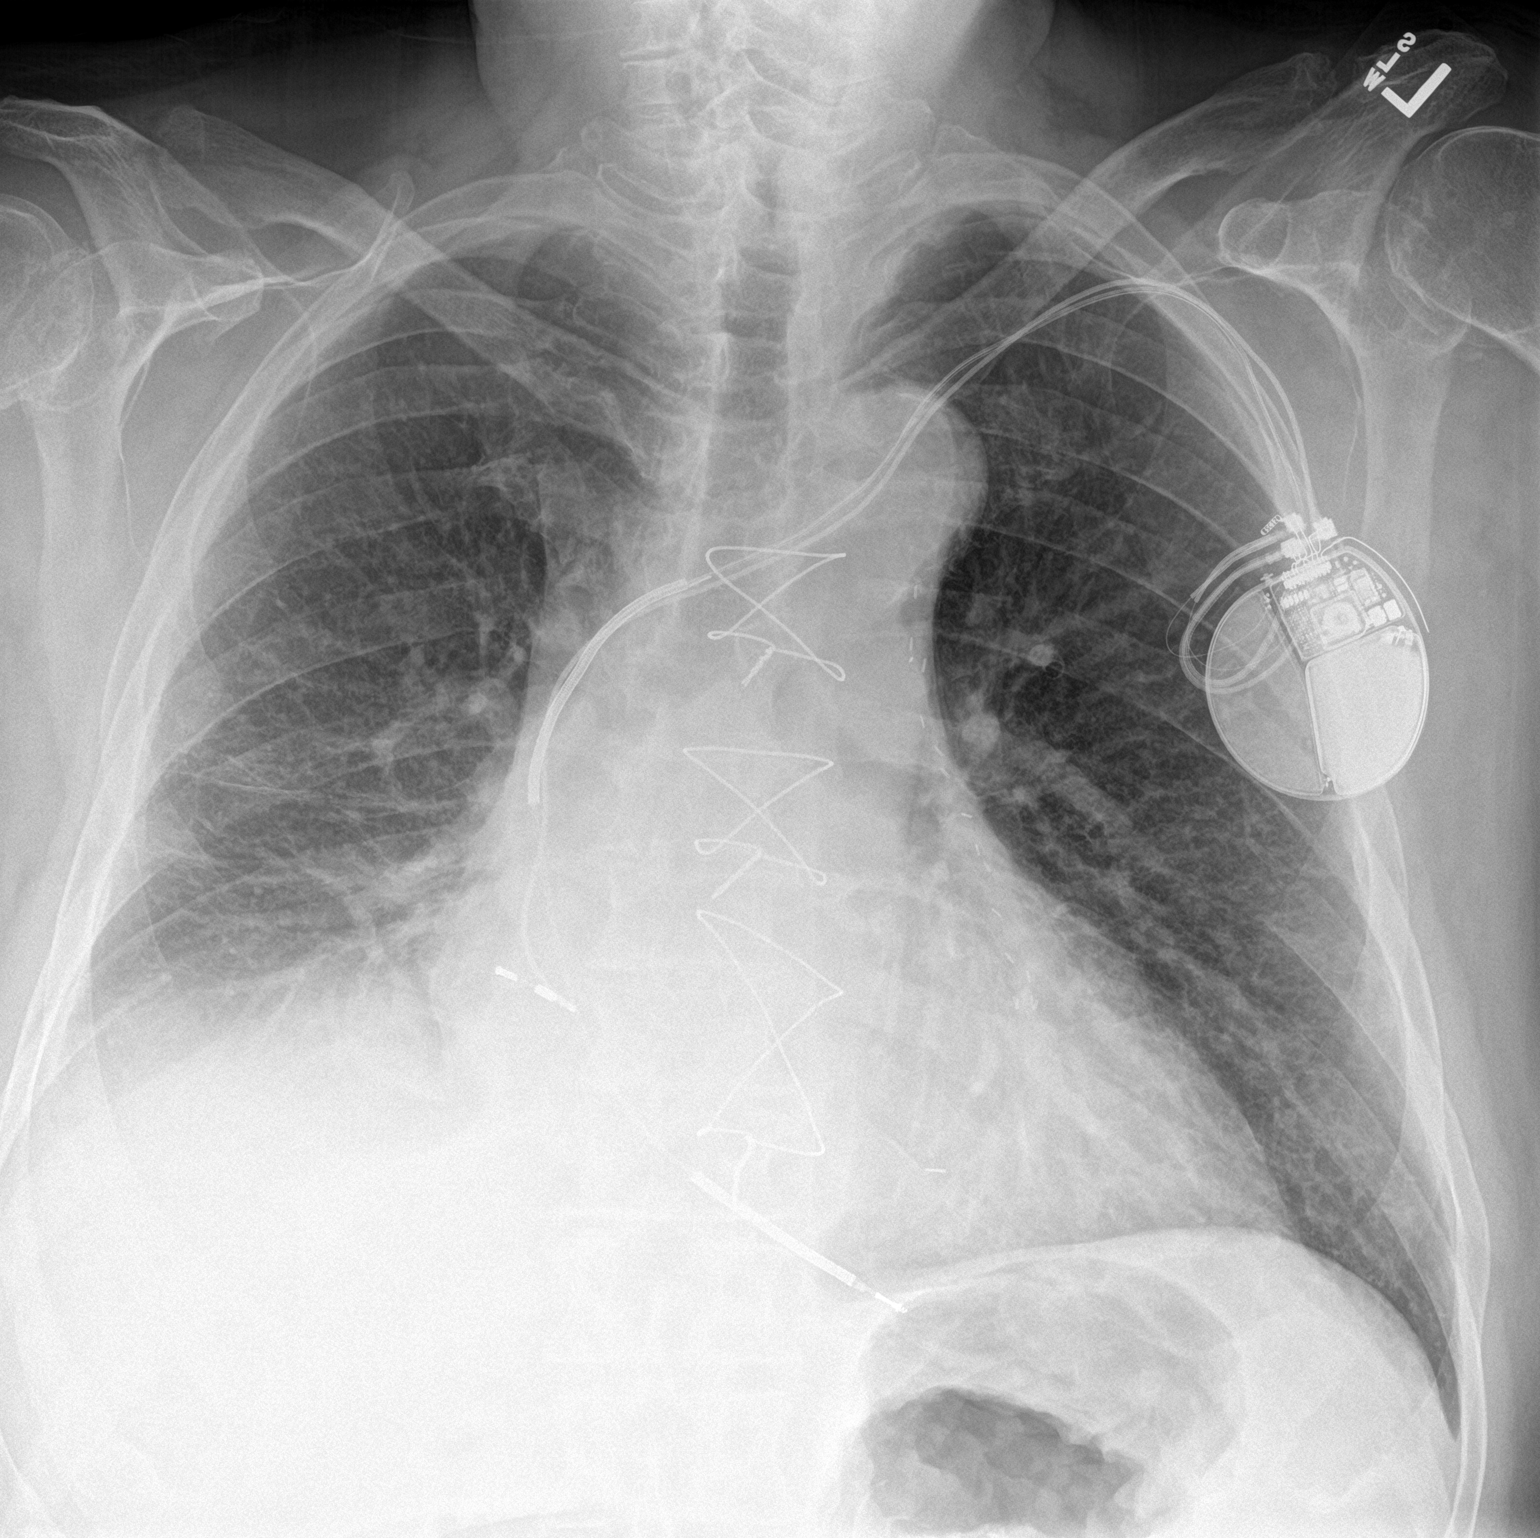

[chest lat]
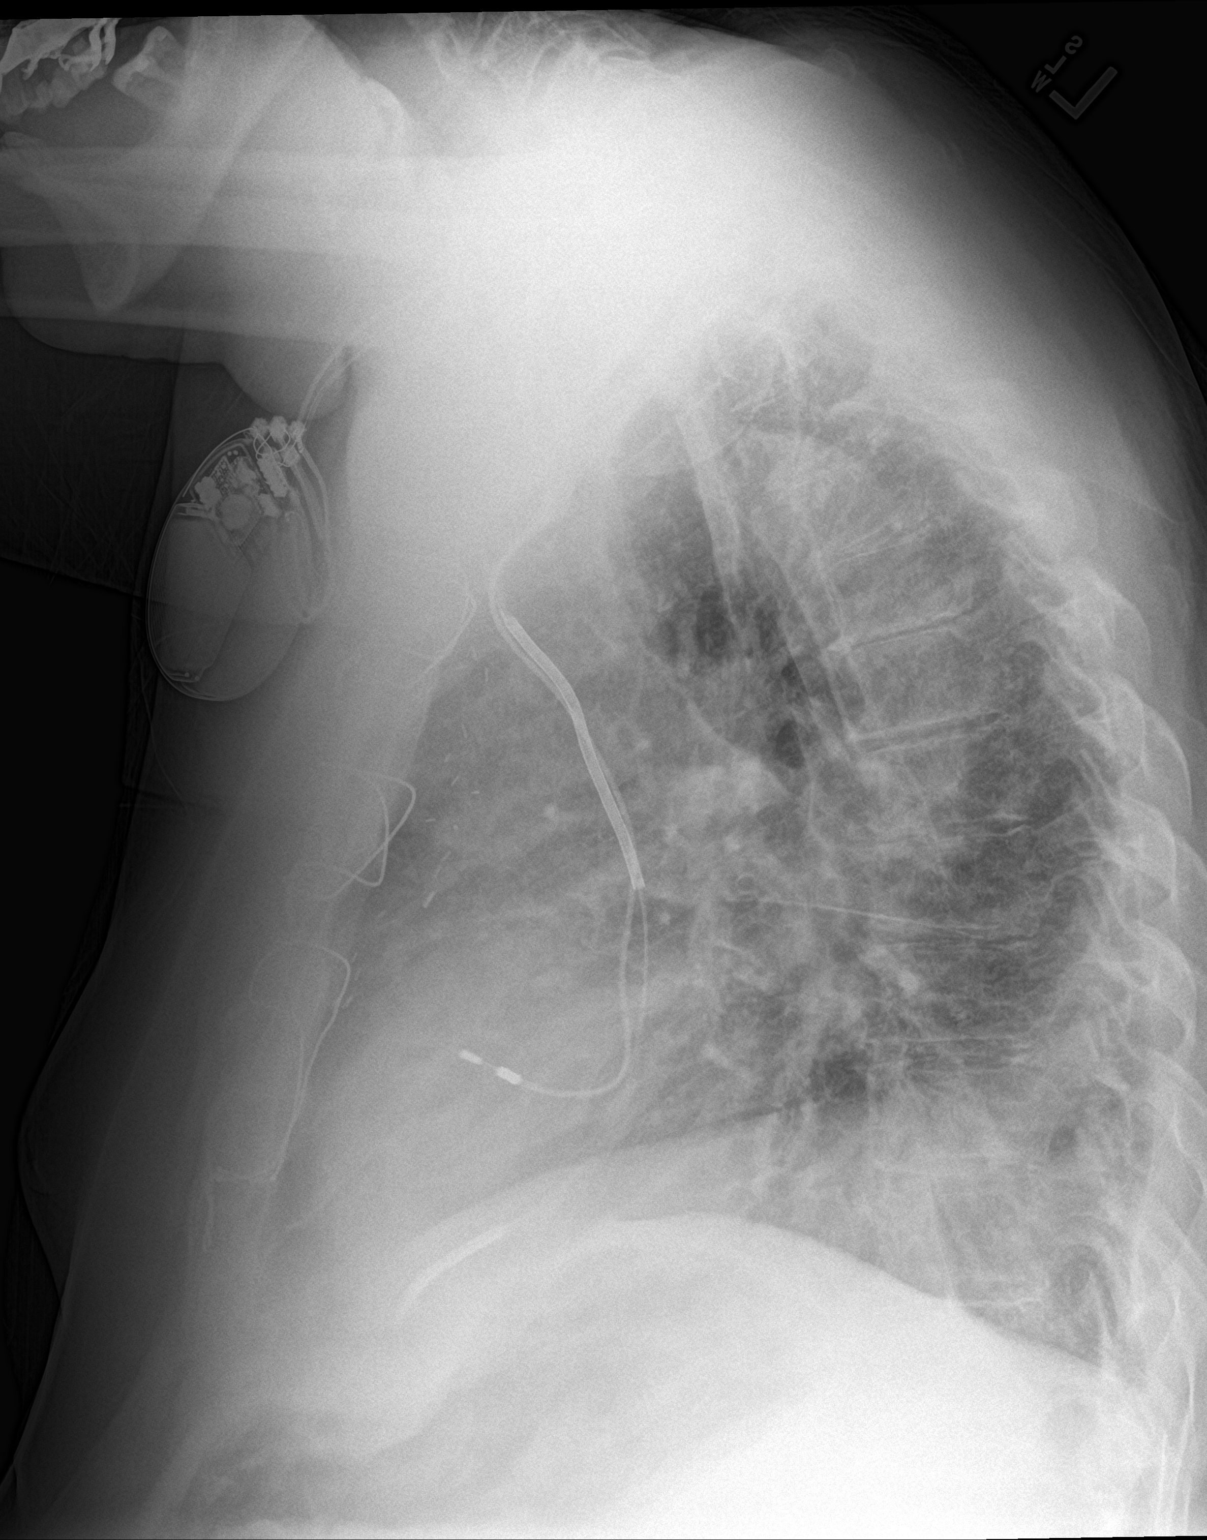

[2 of 2 positions shown; findings below may reference images not displayed]

FINDINGS: Cardiac silhouette is moderately enlarged. Tortuous calcified aorta.
Status post median sternotomy for CABG. Dual lead LEFT cardiac
defibrillator tips projecting RIGHT atrium and RIGHT ventricle.
Elevated RIGHT hemidiaphragm with blunting of the RIGHT costophrenic
angle. Strandy densities RIGHT lung base. No pneumothorax. Soft
tissue planes and included osseous structures are nonsuspicious.
IMPRESSION: Small to moderate RIGHT pleural effusion with suspected subpulmonic
component. RIGHT lung base atelectasis.

Moderate cardiomegaly.

Aortic Atherosclerosis ([id number]).

## 2020-07-07 IMAGING — DX RIGHT SHOULDER - 2+ VIEW
3 series · 3 of 3 positions shown · non-contrast
Comparison: None.

CLINICAL DATA: Pain without trauma

EXAM:
RIGHT SHOULDER - 2+ VIEW

[shoulder grashey]
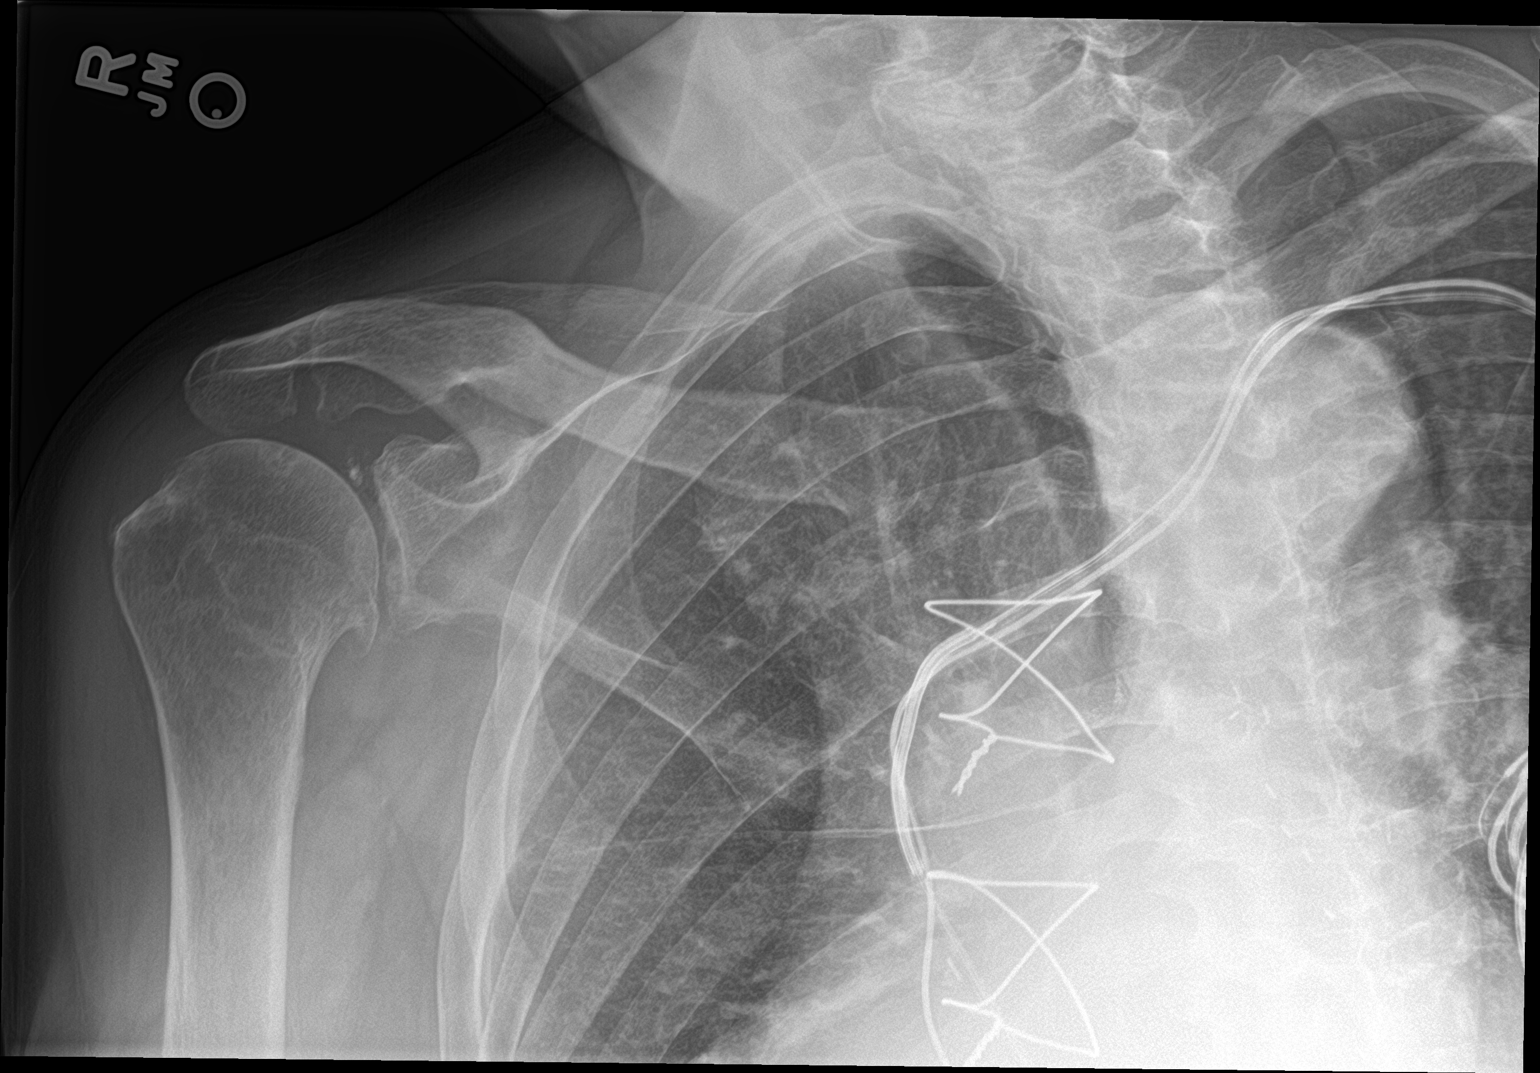

[shoulder y view]
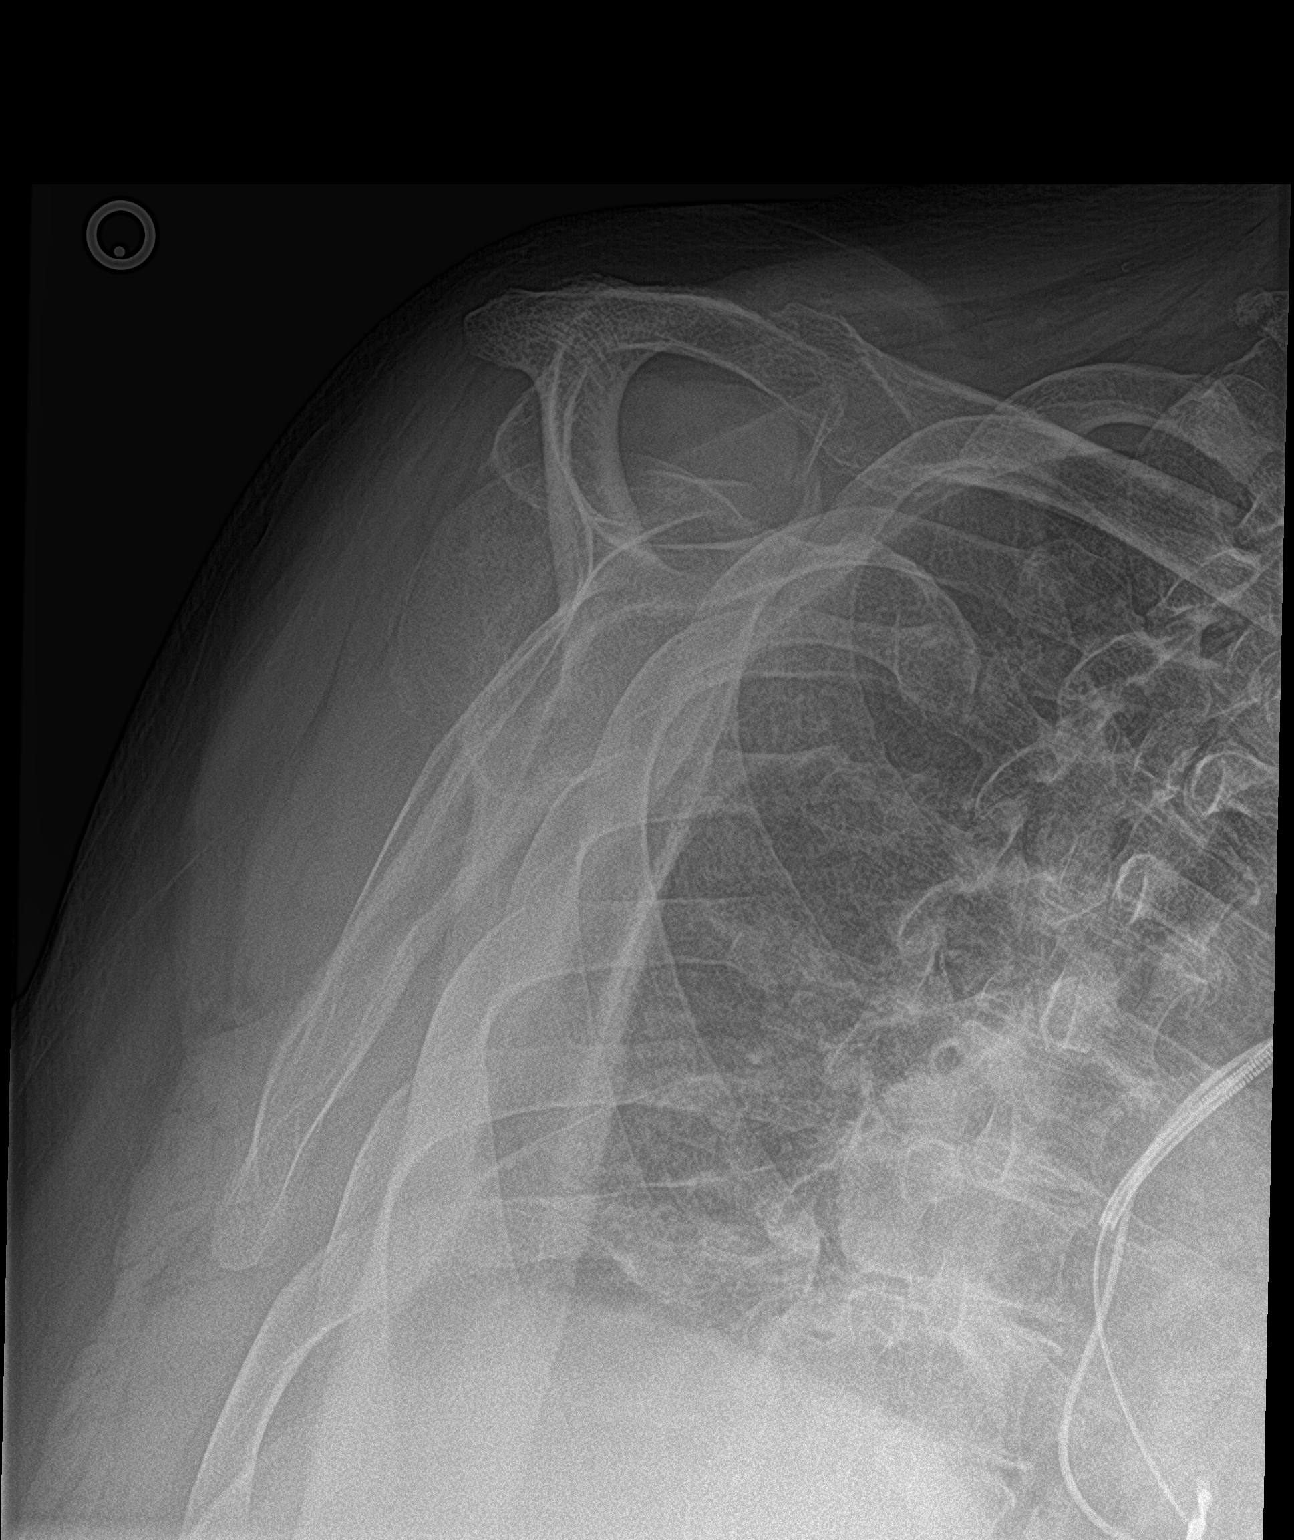

[shoulder axillary]
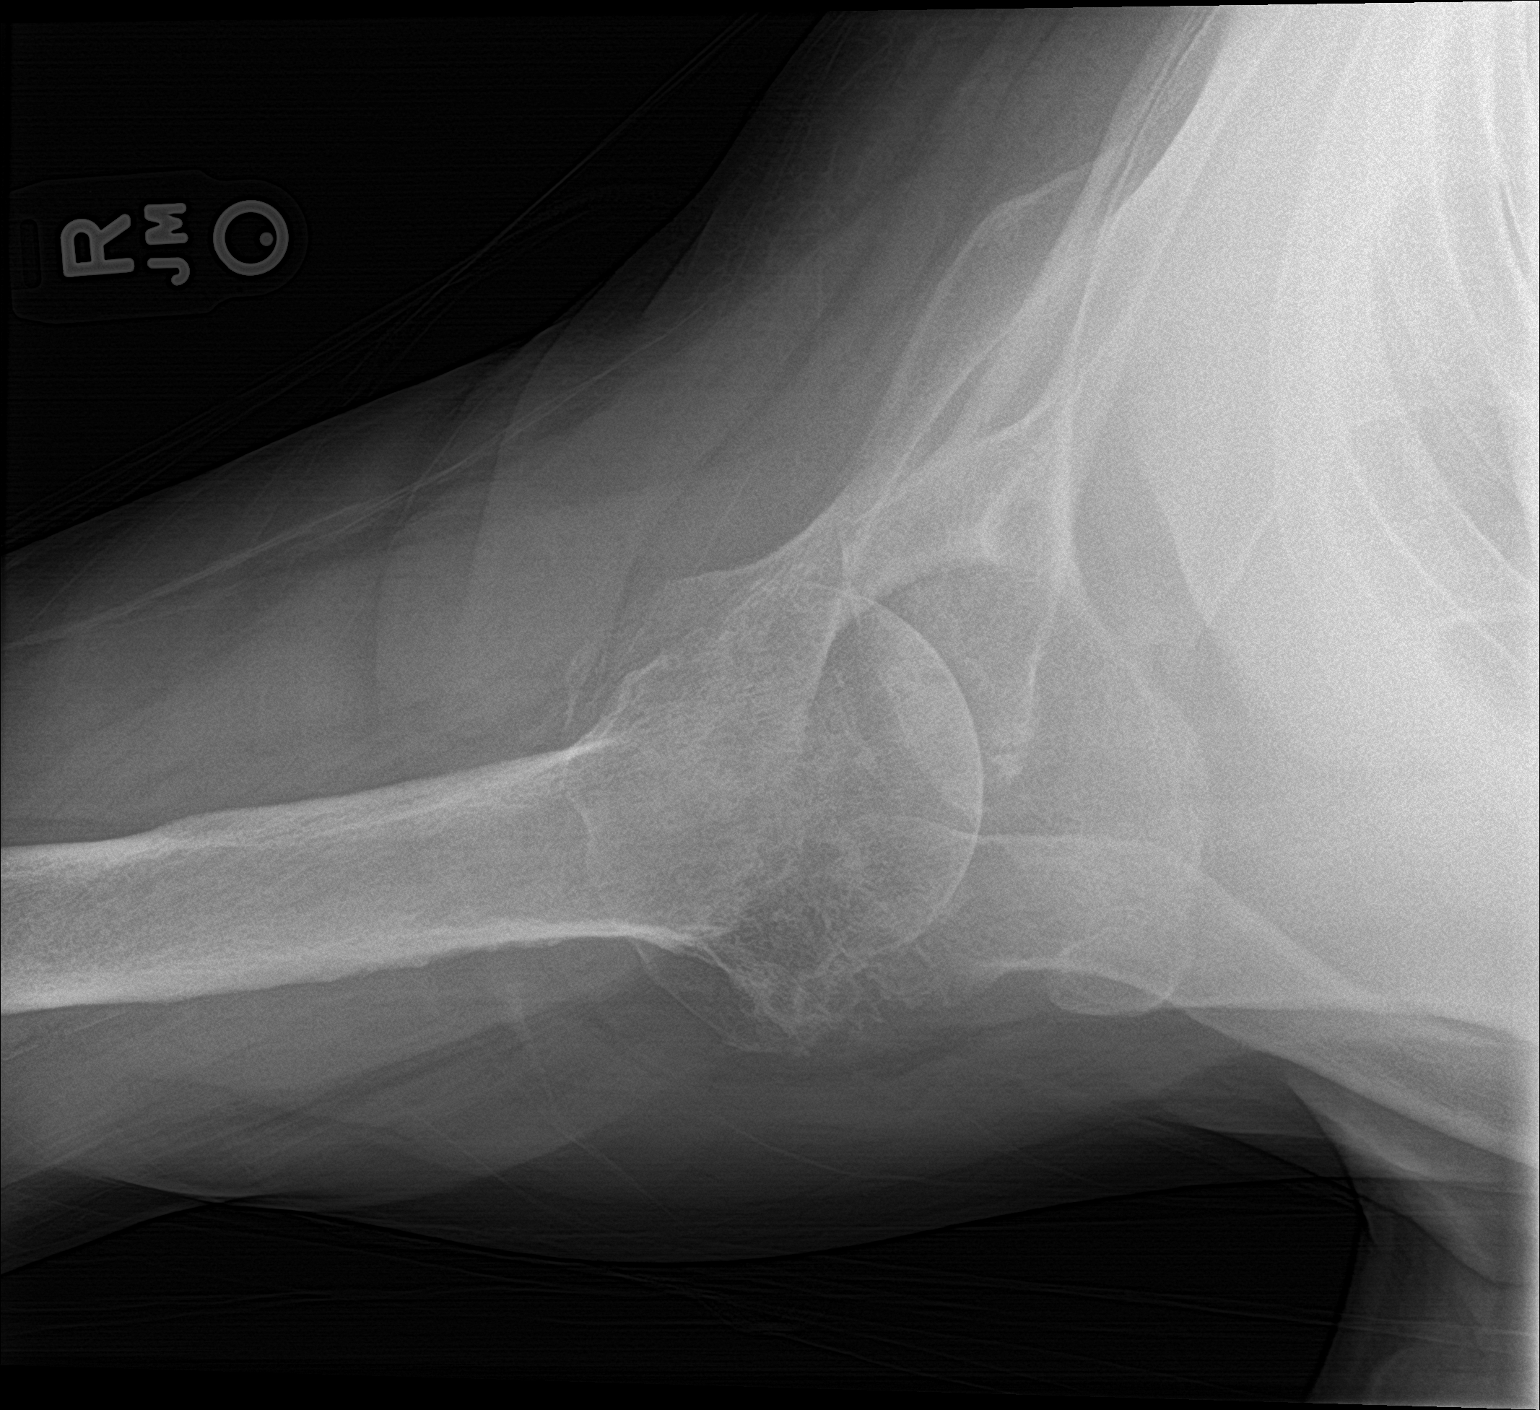

[3 of 3 positions shown; findings below may reference images not displayed]

FINDINGS: Moderate degenerative changes are seen with osteophytes off the
humeral head and glenoid. No fracture or dislocation. No other acute
abnormalities.
IMPRESSION: Moderate degenerative changes in the glenohumeral joint.
